# Patient Record
Sex: Male | Born: 1945 | Race: White | Hispanic: No | Marital: Married | Smoking: Former smoker
Health system: Southern US, Community
[De-identification: ages and names within clinical notes are randomized; demographics above are authoritative.]

## PROBLEM LIST (undated history)

## (undated) DIAGNOSIS — I253 Aneurysm of heart: Secondary | ICD-10-CM

## (undated) DIAGNOSIS — M1990 Unspecified osteoarthritis, unspecified site: Secondary | ICD-10-CM

## (undated) DIAGNOSIS — F32A Depression, unspecified: Secondary | ICD-10-CM

## (undated) DIAGNOSIS — C801 Malignant (primary) neoplasm, unspecified: Secondary | ICD-10-CM

## (undated) DIAGNOSIS — E119 Type 2 diabetes mellitus without complications: Secondary | ICD-10-CM

## (undated) DIAGNOSIS — J189 Pneumonia, unspecified organism: Secondary | ICD-10-CM

## (undated) DIAGNOSIS — F4310 Post-traumatic stress disorder, unspecified: Secondary | ICD-10-CM

## (undated) DIAGNOSIS — I2510 Atherosclerotic heart disease of native coronary artery without angina pectoris: Secondary | ICD-10-CM

## (undated) DIAGNOSIS — F329 Major depressive disorder, single episode, unspecified: Secondary | ICD-10-CM

## (undated) DIAGNOSIS — Z5189 Encounter for other specified aftercare: Secondary | ICD-10-CM

## (undated) DIAGNOSIS — K219 Gastro-esophageal reflux disease without esophagitis: Secondary | ICD-10-CM

## (undated) DIAGNOSIS — D179 Benign lipomatous neoplasm, unspecified: Secondary | ICD-10-CM

## (undated) DIAGNOSIS — E785 Hyperlipidemia, unspecified: Secondary | ICD-10-CM

## (undated) DIAGNOSIS — G4730 Sleep apnea, unspecified: Secondary | ICD-10-CM

## (undated) DIAGNOSIS — T7840XA Allergy, unspecified, initial encounter: Secondary | ICD-10-CM

## (undated) DIAGNOSIS — F419 Anxiety disorder, unspecified: Secondary | ICD-10-CM

## (undated) DIAGNOSIS — D649 Anemia, unspecified: Secondary | ICD-10-CM

## (undated) DIAGNOSIS — I10 Essential (primary) hypertension: Secondary | ICD-10-CM

## (undated) HISTORY — DX: Unspecified osteoarthritis, unspecified site: M19.90

## (undated) HISTORY — DX: Sleep apnea, unspecified: G47.30

## (undated) HISTORY — DX: Encounter for other specified aftercare: Z51.89

## (undated) HISTORY — DX: Major depressive disorder, single episode, unspecified: F32.9

## (undated) HISTORY — PX: COLONOSCOPY: SHX174

## (undated) HISTORY — DX: Anxiety disorder, unspecified: F41.9

## (undated) HISTORY — PX: TONSILLECTOMY: SUR1361

## (undated) HISTORY — DX: Aneurysm of heart: I25.3

## (undated) HISTORY — DX: Essential (primary) hypertension: I10

## (undated) HISTORY — PX: APPENDECTOMY: SHX54

## (undated) HISTORY — DX: Malignant (primary) neoplasm, unspecified: C80.1

## (undated) HISTORY — DX: Gastro-esophageal reflux disease without esophagitis: K21.9

## (undated) HISTORY — PX: OTHER SURGICAL HISTORY: SHX169

## (undated) HISTORY — DX: Benign lipomatous neoplasm, unspecified: D17.9

## (undated) HISTORY — DX: Type 2 diabetes mellitus without complications: E11.9

## (undated) HISTORY — DX: Allergy, unspecified, initial encounter: T78.40XA

## (undated) HISTORY — DX: Hyperlipidemia, unspecified: E78.5

## (undated) HISTORY — PX: CARDIAC CATHETERIZATION: SHX172

## (undated) HISTORY — DX: Depression, unspecified: F32.A

---

## 1973-03-26 HISTORY — PX: EXCISIONAL HEMORRHOIDECTOMY: SHX1541

## 1997-08-10 ENCOUNTER — Inpatient Hospital Stay (HOSPITAL_COMMUNITY): Admission: EM | Admit: 1997-08-10 | Discharge: 1997-08-11 | Payer: Self-pay | Admitting: Emergency Medicine

## 2000-03-26 DIAGNOSIS — Z5189 Encounter for other specified aftercare: Secondary | ICD-10-CM

## 2000-03-26 DIAGNOSIS — C801 Malignant (primary) neoplasm, unspecified: Secondary | ICD-10-CM

## 2000-03-26 DIAGNOSIS — IMO0001 Reserved for inherently not codable concepts without codable children: Secondary | ICD-10-CM

## 2000-03-26 HISTORY — DX: Reserved for inherently not codable concepts without codable children: IMO0001

## 2000-03-26 HISTORY — PX: PARTIAL COLECTOMY: SHX5273

## 2000-03-26 HISTORY — DX: Encounter for other specified aftercare: Z51.89

## 2000-03-26 HISTORY — DX: Malignant (primary) neoplasm, unspecified: C80.1

## 2001-09-08 ENCOUNTER — Inpatient Hospital Stay (HOSPITAL_COMMUNITY): Admission: EM | Admit: 2001-09-08 | Discharge: 2001-09-17 | Payer: Self-pay

## 2001-09-08 ENCOUNTER — Encounter (INDEPENDENT_AMBULATORY_CARE_PROVIDER_SITE_OTHER): Payer: Self-pay | Admitting: *Deleted

## 2001-09-11 ENCOUNTER — Encounter: Payer: Self-pay | Admitting: General Surgery

## 2001-09-16 ENCOUNTER — Encounter: Payer: Self-pay | Admitting: Sports Medicine

## 2001-09-30 ENCOUNTER — Encounter: Admission: RE | Admit: 2001-09-30 | Discharge: 2001-09-30 | Payer: Self-pay | Admitting: Sports Medicine

## 2002-04-29 ENCOUNTER — Ambulatory Visit (HOSPITAL_COMMUNITY): Admission: RE | Admit: 2002-04-29 | Discharge: 2002-04-29 | Payer: Self-pay | Admitting: *Deleted

## 2002-04-29 ENCOUNTER — Encounter: Payer: Self-pay | Admitting: *Deleted

## 2002-12-01 ENCOUNTER — Ambulatory Visit (HOSPITAL_COMMUNITY): Admission: RE | Admit: 2002-12-01 | Discharge: 2002-12-01 | Payer: Self-pay | Admitting: Hematology & Oncology

## 2002-12-01 ENCOUNTER — Encounter: Payer: Self-pay | Admitting: Hematology & Oncology

## 2003-06-10 ENCOUNTER — Ambulatory Visit (HOSPITAL_COMMUNITY): Admission: RE | Admit: 2003-06-10 | Discharge: 2003-06-10 | Payer: Self-pay | Admitting: Oncology

## 2003-07-27 ENCOUNTER — Ambulatory Visit (HOSPITAL_BASED_OUTPATIENT_CLINIC_OR_DEPARTMENT_OTHER): Admission: RE | Admit: 2003-07-27 | Discharge: 2003-07-27 | Payer: Self-pay | Admitting: Emergency Medicine

## 2004-06-23 ENCOUNTER — Ambulatory Visit: Payer: Self-pay | Admitting: Oncology

## 2004-06-27 ENCOUNTER — Ambulatory Visit (HOSPITAL_COMMUNITY): Admission: RE | Admit: 2004-06-27 | Discharge: 2004-06-27 | Payer: Self-pay | Admitting: Oncology

## 2004-12-29 ENCOUNTER — Ambulatory Visit: Payer: Self-pay | Admitting: Oncology

## 2005-01-02 ENCOUNTER — Ambulatory Visit (HOSPITAL_COMMUNITY): Admission: RE | Admit: 2005-01-02 | Discharge: 2005-01-02 | Payer: Self-pay | Admitting: Oncology

## 2005-06-14 IMAGING — CT CT ABDOMEN W/ CM
2 of 7 series · 11 of 35 positions shown, 18 images · IV contrast (omnipaque)
Comparison: 12/01/02.

CLINICAL DATA: Colon cancer ? post partial colectomy.  Follow-up exam.
 CT CHEST, ABDOMEN, AND PELVIS WITH CONTRAST
TECHNIQUE: Multidetector helical imaging carried out through the chest, abdomen, and pelvis utilizing oral and IV contrast (150 cc of Omnipaque 300 ? nonionic contrast used because of a history of asthma).

[Series 2: — · coronal · 1.33mm/px · 1 of 3 slices shown, 2 images (1 of 2)]
[im 2/3  soft-tissue]
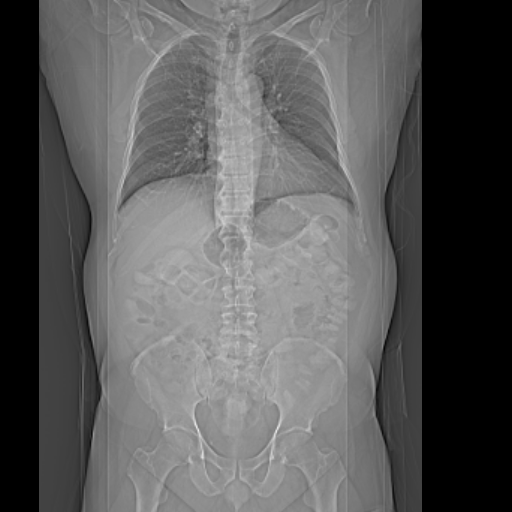
[im 2/3  bone]
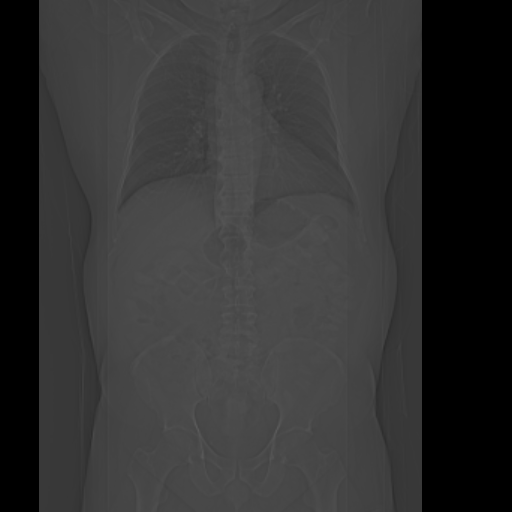

[Series 5: — · axial · 0.81mm/px · z∈[+788,+1208]mm · 10 of 104 slices shown, 16 images (2 of 2)]
[im 10/104  soft-tissue]
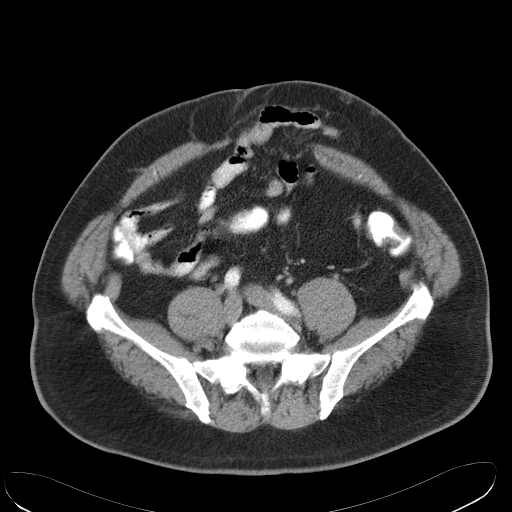
[im 10/104  bone]
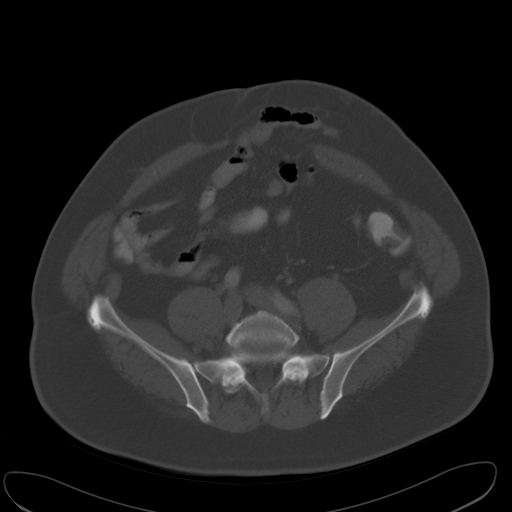
[im 19/104  soft-tissue]
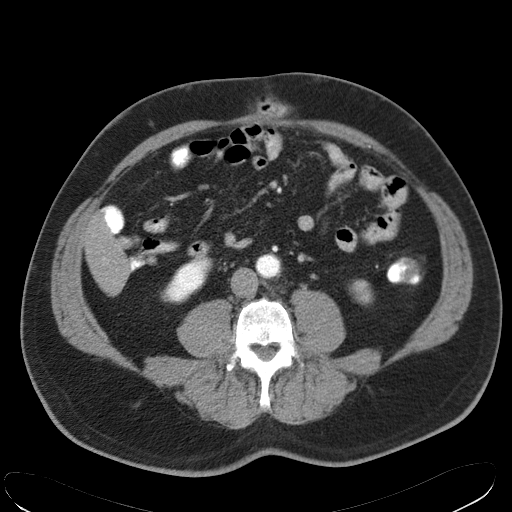
[im 29/104  soft-tissue]
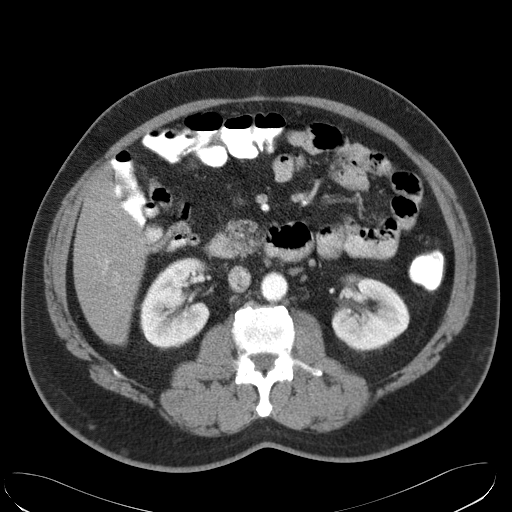
[im 38/104  soft-tissue]
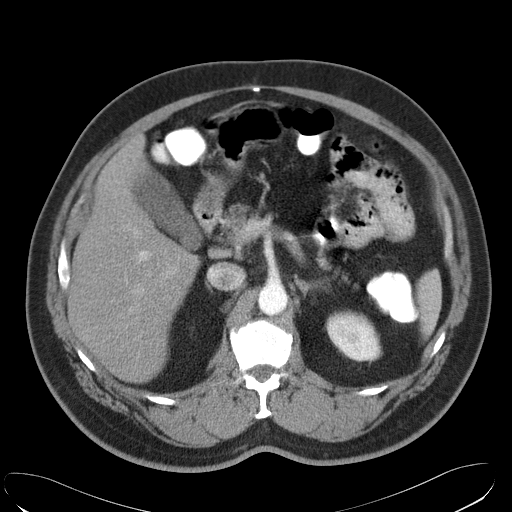
[im 47/104  soft-tissue]
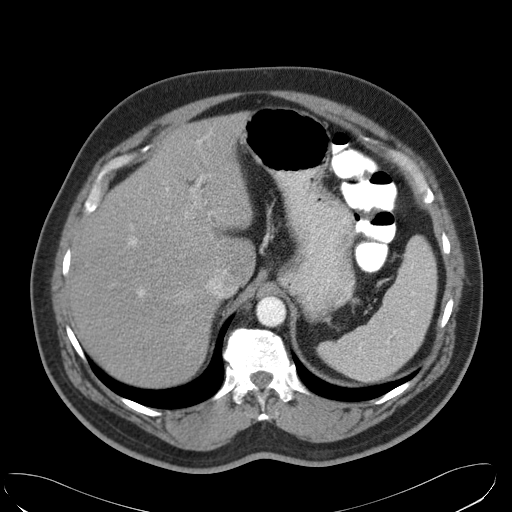
[im 57/104  soft-tissue]
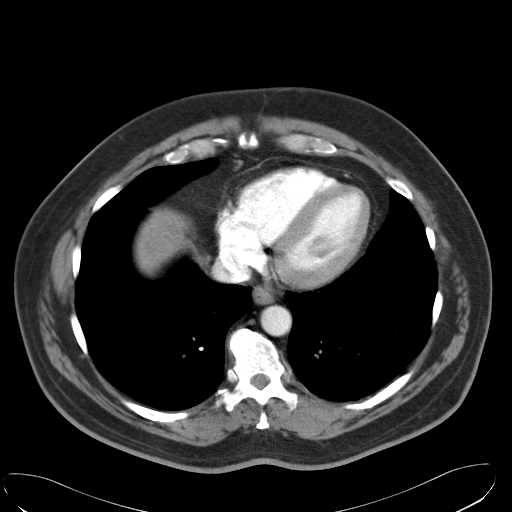
[im 66/104  soft-tissue]
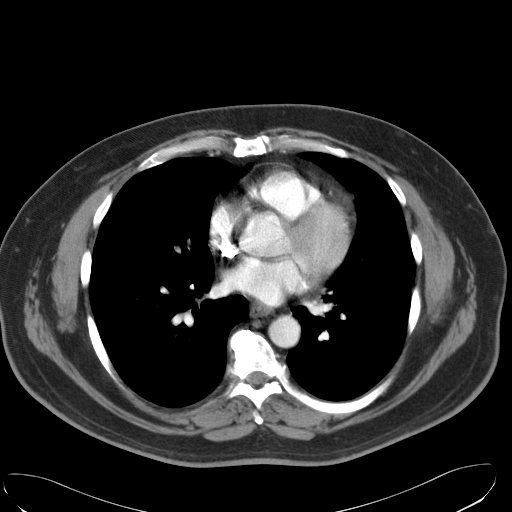
[im 66/104  lung]
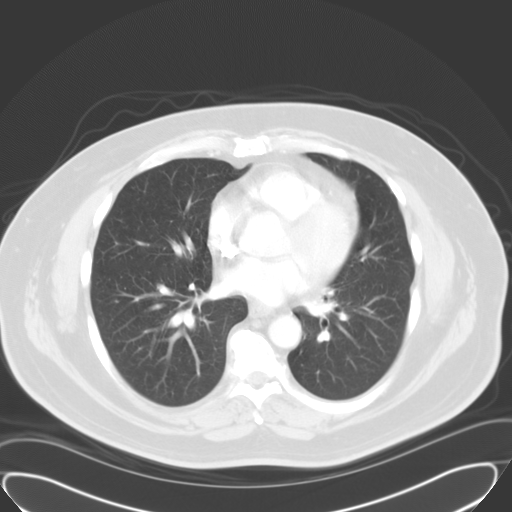
[im 75/104  soft-tissue]
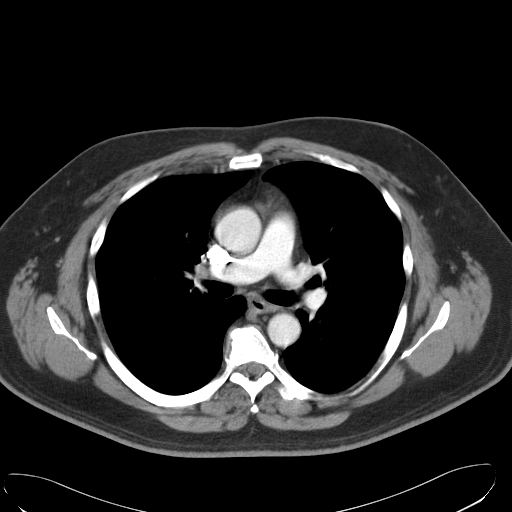
[im 75/104  lung]
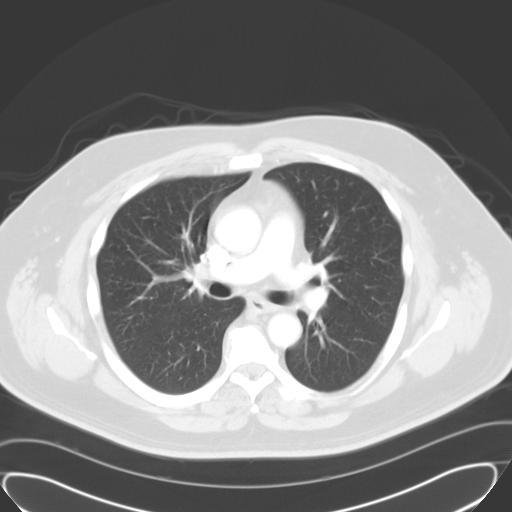
[im 85/104  soft-tissue]
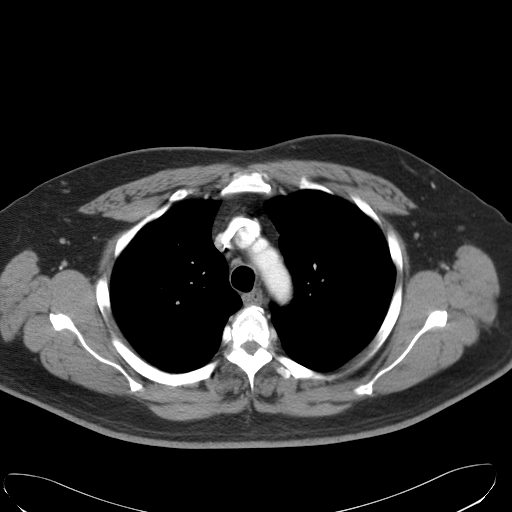
[im 85/104  lung]
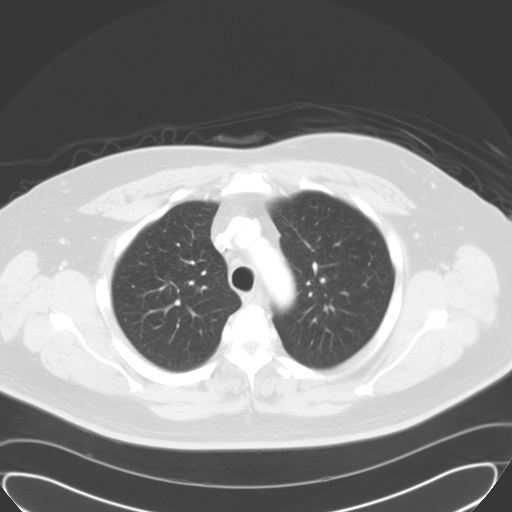
[im 85/104  bone]
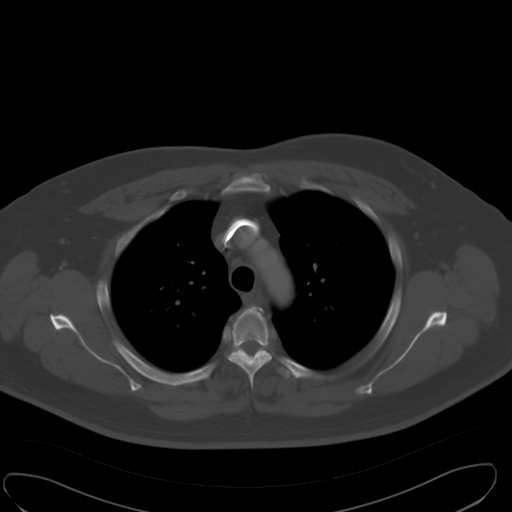
[im 94/104  soft-tissue]
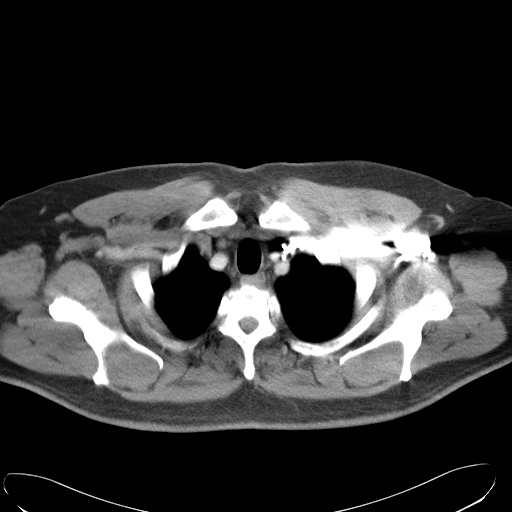
[im 94/104  lung]
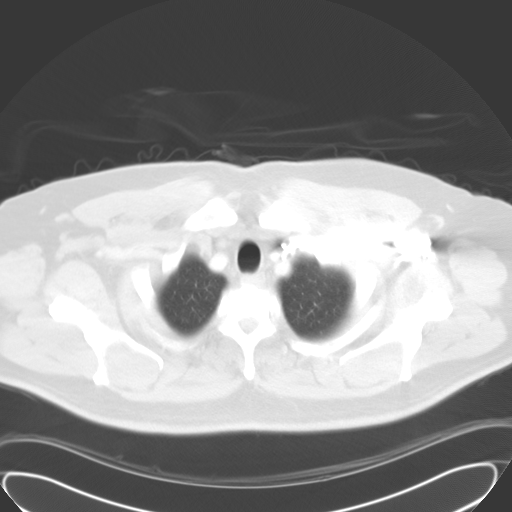

[11 of 35 positions shown; findings below may reference images not displayed]

CT CHEST WITH CONTRAST 
 No mediastinal adenopathy.  No pleural or pericardial fluid.  No lung nodules or airspace disease.  No obvious chest wall lesions. 
 IMPRESSION
 No metastatic disease or other significant findings.
 CT ABDOMEN WITH CONTRAST 
 No lesions of the liver or spleen.  Pancreas and adrenals normal.  No calcified gallstones or biliary ductal dilation.  Kidneys normal.  No ascites or adenopathy. 
 Again noted is a ventral hernia beginning in the periumbilical area and extending down into the pelvis.  It contains loops of small bowel as before.  No evidence for incarceration or obstruction.  
 IMPRESSION
 1.  No evidence for metastatic disease.
 2.  Anterior ventral hernia as before.
 CT PELVIS WITH CONTRAST 
 No focal masses, adenopathy, or fluid collections.  Prostate mildly enlarged containing one calcification. 
 IMPRESSION
 No evidence for metastatic disease or other significant findings.  The prostate appears mildly enlarged.

## 2005-07-09 ENCOUNTER — Ambulatory Visit: Payer: Self-pay | Admitting: Oncology

## 2005-07-12 ENCOUNTER — Ambulatory Visit (HOSPITAL_COMMUNITY): Admission: RE | Admit: 2005-07-12 | Discharge: 2005-07-12 | Payer: Self-pay | Admitting: Oncology

## 2005-08-30 ENCOUNTER — Ambulatory Visit: Payer: Self-pay | Admitting: Gastroenterology

## 2005-09-13 ENCOUNTER — Ambulatory Visit: Payer: Self-pay | Admitting: Internal Medicine

## 2006-01-10 ENCOUNTER — Ambulatory Visit: Payer: Self-pay | Admitting: Oncology

## 2006-02-08 ENCOUNTER — Inpatient Hospital Stay (HOSPITAL_COMMUNITY): Admission: EM | Admit: 2006-02-08 | Discharge: 2006-02-12 | Payer: Self-pay | Admitting: Emergency Medicine

## 2006-07-08 ENCOUNTER — Ambulatory Visit: Payer: Self-pay | Admitting: Oncology

## 2006-07-09 LAB — COMPREHENSIVE METABOLIC PANEL
BUN: 15 mg/dL (ref 6–23)
CO2: 28 mEq/L (ref 19–32)
Creatinine, Ser: 0.93 mg/dL (ref 0.40–1.50)
Glucose, Bld: 97 mg/dL (ref 70–99)
Total Bilirubin: 1 mg/dL (ref 0.3–1.2)

## 2006-07-09 LAB — CBC WITH DIFFERENTIAL (CANCER CENTER ONLY)
BASO#: 0 10*3/uL (ref 0.0–0.2)
Eosinophils Absolute: 0.2 10*3/uL (ref 0.0–0.5)
HGB: 14.7 g/dL (ref 13.0–17.1)
MCH: 32.4 pg (ref 28.0–33.4)
MCV: 95 fL (ref 82–98)
MONO#: 0.5 10*3/uL (ref 0.1–0.9)
MONO%: 8.8 % (ref 0.0–13.0)
NEUT#: 2.2 10*3/uL (ref 1.5–6.5)
RBC: 4.53 10*6/uL (ref 4.20–5.70)
WBC: 5.4 10*3/uL (ref 4.0–10.0)

## 2006-07-09 LAB — CEA: CEA: 1.5 ng/mL (ref 0.0–5.0)

## 2006-07-11 ENCOUNTER — Ambulatory Visit (HOSPITAL_COMMUNITY): Admission: RE | Admit: 2006-07-11 | Discharge: 2006-07-11 | Payer: Self-pay | Admitting: Oncology

## 2007-07-01 ENCOUNTER — Ambulatory Visit: Payer: Self-pay | Admitting: Oncology

## 2007-07-09 LAB — COMPREHENSIVE METABOLIC PANEL
ALT: 27 U/L (ref 0–53)
AST: 22 U/L (ref 0–37)
Albumin: 4.3 g/dL (ref 3.5–5.2)
Alkaline Phosphatase: 41 U/L (ref 39–117)
Glucose, Bld: 96 mg/dL (ref 70–99)
Potassium: 3.5 mEq/L (ref 3.5–5.3)
Sodium: 141 mEq/L (ref 135–145)
Total Protein: 7.1 g/dL (ref 6.0–8.3)

## 2007-07-09 LAB — MANUAL DIFFERENTIAL (CHCC SATELLITE)
ALC: 3.5 10*3/uL — ABNORMAL HIGH (ref 0.9–3.3)
MONO: 2 % (ref 0–13)
PLT EST ~~LOC~~: ADEQUATE
SEG: 32 % — ABNORMAL LOW (ref 40–75)

## 2007-07-09 LAB — CBC WITH DIFFERENTIAL (CANCER CENTER ONLY)
HCT: 42.6 % (ref 38.7–49.9)
HGB: 14.6 g/dL (ref 13.0–17.1)
RDW: 12.1 % (ref 10.5–14.6)
WBC: 5.6 10*3/uL (ref 4.0–10.0)

## 2007-07-10 ENCOUNTER — Ambulatory Visit (HOSPITAL_COMMUNITY): Admission: RE | Admit: 2007-07-10 | Discharge: 2007-07-10 | Payer: Self-pay | Admitting: Oncology

## 2010-05-17 ENCOUNTER — Other Ambulatory Visit: Payer: Self-pay | Admitting: Emergency Medicine

## 2010-08-11 NOTE — Cardiovascular Report (Signed)
NAME:  Ryan Hernandez, Ryan Hernandez NO.:  [id number]   MEDICAL RECORD NO.:  [id number]          PATIENT TYPE:  INP   LOCATION:  2807                         FACILITY:  [facility]   PHYSICIAN:  Vesta Mixer, M.D. DATE OF BIRTH:  1945-11-24   DATE OF PROCEDURE:  DATE OF DISCHARGE:                              CARDIAC CATHETERIZATION   PROCEDURE:  Was a percutaneous transluminal coronary angioplasty of a second  diagonal vessel.   [name] is a 65 year old gentleman with new onset chest pain.  He was  admitted by Dr. Donnie Aho over the weekend.  He had a diagnostic heart  catheterization by Dr. Donnie Aho which revealed a tight second diagonal  stenosis.  He has a tight second obtuse marginal artery.  He also has a  tight distal right coronary artery stenosis.  We decided to proceed with  intervention of the larger of vessel which is the diagonal vessel.   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  Please see the dictated note from Dr. York Spaniel diagnostic  catheterization.   The left main was cannulated using a 6-French Judkins left 4.5 guide.  An  Asahi soft wire was placed down into the diagonal vessel.  A 2.0 x 15-mm  Maverick was positioned across the stenosis.  It was inflated up to 6  atmospheres for 26 seconds followed by 12 atmospheres for 50 seconds.  This  resulted in some improvement but was still clearly an inadequate result.  A  2.5 x 15-mm balloon was then positioned across the stenosis and was inflated  up to 6 atmospheres for 21 seconds followed by 8 atmospheres for 48 seconds.  One inflation of 12 atmospheres for 56 seconds was performed.  This resulted  in a nice angiographic lumen.  There is a mild amount of residual stenosis.  The diameter of the vessel proximal to the aneurysm is approximately equal  to the diameter distal to the aneurysm.  It was decided not to place a stent  because the stent would need to  extend out into the aneurysm.  I am not sure  that we would be able to adequately post dilate that stent.   COMPLICATIONS:  None.   CONCLUSION:  1. 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  2. Status post successful percutaneous transluminal coronary intervention      of the second diagonal artery.   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  He has got brisk  flow at this time.  If he restenosis, then we would need to consider putting  in a stent.  He has diffuse disease throughout his coronary anatomy.  He has  a tight stenosis in the second obtuse marginal artery.  This vessel was  subtotally occluded, and it is fairly small in diameter.  It is  approximately 1 to 1.5 mm in diameter.   The right coronary artery is large and dominant.  He has a distal stenosis  of approximately 60% in the distal right coronary artery.  We will continue  with Plavix.           ______________________________  Vesta Mixer, M.D.     PJN/MEDQ  D:  02/11/2006  T:  02/11/2006  Job:  [id number]   cc:   Georga Hacking, M.D.

## 2010-08-11 NOTE — H&P (Signed)
NAME:  Ryan Hernandez, Ryan Hernandez NO.:  [id number]   MEDICAL RECORD NO.:  [id number]          PATIENT TYPE:  EMS   LOCATION:  MAJO                         FACILITY:  [facility]   PHYSICIAN:  Georga Hacking, M.D.DATE OF BIRTH:  1945-07-10   DATE OF ADMISSION:  02/08/2006  DATE OF DISCHARGE:                              HISTORY & PHYSICAL   REASON FOR ADMISSION:  Chest pain.   HISTORY:  This 65 year old male is brought into the hospital to evaluate  chest pain and to rule out myocardial infarction. The patient has a  previous history of hypertension, hyperlipidemia, and obesity. He states  he was hospitalized around 7-8 years ago with fluid around his heart but  cannot remember the details of the work up.  He also states that he had  a stress test followed by a nuclear stress-test around 4-5 years ago but  cannot remember who he saw and does not give the name of the  cardiologist. He was in his usual state of health today and was at work  when he had onset of midsternal chest discomfort  described as pressure  or heaviness which radiated to the inner aspect of the left arm and was  associated with mild shortness of breath. He got up and walked around,  the discomfort eased off but returned and became more severe, and again  radiated to the left arm and was associated with shortness of breath.  He was brought by EMS to the emergency room where the pain was relieved  with nitroglycerin and after a nitroglycerin and aspirin. He has had  some slight recurrence of the pain since then.  His initial EKG and  initial cardiac enzymes were unremarkable.   PAST MEDICAL HISTORY:  1. He has a previous history of hypertension.  2. There is a possible history of fluid around the heart.  3. He has a previous history of diverticulosis for several years.  4. He has a known history of hyperlipidemia.  5. There is a previous history of prostatitis and a previous history      of hemorrhoids as  well as lipomas.   PAST SURGICAL HISTORY:  Tonsillectomy; appendectomy; he has had surgery  for colon cancer with a partial colectomy in 2003.   ALLERGIES:  None.   CURRENT MEDICATIONS:  Lipitor, Hyzaar, Norvasc and Prevacid.   FAMILY HISTORY:  Father died at age 24 of congestive heart failure. His  mother is living and has hypertension. He has two half-brothers living  and two sons living and well. He has an adopted daughter and an adopted  granddaughter.   SOCIAL HISTORY:  He has been married to his wife for 37 years. He has  two sons and an adopted daughter 49, and a 77-year-old granddaughter they  have also adopted.  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 He attends [employer]. He has a history of cigarette smoking for 30 years, he  quit smoking around 23 years ago. No significant alcohol use.   REVIEW OF SYSTEMS:  He has gained some weight in the  past year. He has  no skin problems. He wears glasses but no history of glaucoma or  cataracts. He has mild difficulty with hearing. He has no difficulty  swallowing. He has a history of dyspepsia and takes Prevacid. He has a  history of prostatitis and has some mild hesitancy. He has a history of  low back pain, has arthritis of the knees and has been told he might  need to have a knee replacement. He describes indigestion previously.   PHYSICAL EXAMINATION:  GENERAL: He is a pleasant male appearing his  stated age who is in no acute distress.  VITAL SIGNS: His blood pressure was 123/68, temperature was 97.1, pulse  was 74.  SKIN: Warm and dry. He is somewhat balding.  ENT: Wears glasses. Normocephalic, atraumatic. Fundi not examined.  Pharynx negative, no occipital masses, JVD or thyromegaly. No bruits.  LUNGS: Clear to A&P.  CARDIAC EXAM: Normal S1 and S2, no S3, no murmur.  ABDOMEN: Soft, nontender, mild ventral hernia noted. Femoral and distal  pulses 2+, no edema noted.   A 12-lead EKG is  normal.   LABORATORY DATA:  Shows a normal CBC. Normal PT and PTT. His glucose is  102, otherwise chemistry profile is normal. Initial point of care  enzymes was normal.   IMPRESSION:  1. Chest discomfort suggestive of unstable angina pectoris with an      unremarkable EKG.  2. Hypertension.  3. Hyperlipidemia under treatment.  4. History of colon cancer.  5. Obesity.   RECOMMENDATIONS:  The patient will be placed on IV heparin, IV  nitroglycerin. Myocardial infarction will be ruled out. He will be  placed on beta blockers. Because of the recurrent nature of the pain we  will need to proceed with catheterization.      Georga Hacking, M.D.  Electronically Signed     WST/MEDQ  D:  02/08/2006  T:  02/08/2006  Job:  [id number]   cc:   Brett Canales A. Cleta Alberts, M.D.

## 2010-08-11 NOTE — H&P (Signed)
[place]  Patient:    Ryan Hernandez, Ryan Hernandez Visit Number: [id number] MRN: [id number]          Service Type: MED Location: [phone] Attending Physician:  Garnette Scheuermann Dictated by:   Arlis Porta, M.D. Admit Date:  09/08/2001   CC:         Dr. Cleta Alberts, [facility]   History and Physical  CHIEF COMPLAINT:  Rectal bleeding.  HISTORY OF PRESENT ILLNESS:  This is a 65 year old white male with a history of diverticulosis and GI bleeding as recently as 4-1/2 years ago, who presents to his appropriate medical doctor with bright red blood per rectum x3-4 weeks. He has noticed occasional special blood in the stools, progressively getting worse until last night he had a bowel movement with blood that covered the bowl with red.  At his physicians office his hemoglobin was checked and noticed to be a 9.8 which is decreased from his baseline of 14.6 from October of 2002.  He was then sent for further workup in the ED. Associated with this he has noticed some abdominal pain across his middle abdomen which comes and goes, lasting 30 seconds.  Episodes occur every 15-20 minutes.  It is described as stabbing in nature.  It is better with manual pressure and worse with eating, but no specific food patterns are found.  The patient denies any pain with defecation.  He denies any nausea or vomiting. The patient did have an episode of diarrhea three days ago.  The patient has had some sick contacts with upper respiratory symptoms at work.  The patient denies any recent travel The patient has had a recent antibiotic use three months ago for prostatitis.  REVIEW OF SYSTEMS:  The patient denies any fevers.  No cough.  No chest pain. No shortness of breath.  No dysuria.  No hematuria.  PAST MEDICAL HISTORY: 1. Internal hemorrhoids 25 years ago. 2. Diverticulitis 4-1/2 years ago with endoscopy showing no signs of polyps    or cancer. 3.  Hypertension. 4. Hypercholesterolemia. 5. Status post appendectomy. 6. Status post tonsillectomy. 7. Skin "fatty tumors." 8. History of prostatitis with a baseline PSA of 2.44 from May of 2003.  MEDICATIONS: 1. Lipitor 10 mg q.h.s. 2. Hyzaar 100/25 mg q.d. 3. Norvasc 10 mg q.d. 4. Celebrex p.r.n., but the patient denies taking these within the last two    months. 5. Tylenol P.M. q.h.s. for insomnia.  ALLERGIES:  No known drug allergies.  SOCIAL HISTORY:  The patient is a former smoker, last smoking 19 years ago. He does admit to occasional alcohol use, but very infrequently.  His last use was six months ago.  The patient denies any other drug use.  The patient has several titles including a Ship broker, Aeronautical engineer, receiving clerk.  He does admit to oftentimes requiring heavy lifting.  The patient currently lives with his wife and adopted daughter who is four years old.  FAMILY HISTORY:  The patients father is deceased at age 75 secondary to CHF. The patients mother is still alive with a history of hypertension.  The patient has two half sisters who are alive and well.  The patient has two sons who are also alive and well.  PHYSICAL EXAMINATION:  VITAL SIGNS:  Temperature 98.9, pulse was 91, respiratory rate was 14, blood pressure was 108/61, height was 5 feet 10 inches, weight was 236 pounds. Orthostatics were checked, which showed a blood pressure lying down  of 110/70 with a pulse of 82.  Standing up his blood pressure was 120/68 with a pulse of 84.  GENERAL:  He was a pleasant white male in no acute distress.  HEENT:  The patient had no scleral icterus.  His oropharynx showed no erythema or exudates.  LUNGS:  Clear to auscultation bilaterally with good air movement.  HEART:  Regular rate and rhythm, no murmurs.  ABDOMEN:  Mildly obese, soft with normal active bowel sounds.  Tender to palpation at the right upper quadrant and epigastrium.  No  hepatomegaly.  RECTAL:  Per primary M.D.s office, showed bright red blood.  EXTREMITIES:  No edema.  Moves all four extremities well.  LABORATORY:  White blood cell count was 9.2, hemoglobin was 9.8, hematocrit was 31.5, platelet count was 446.  The patient had baseline labs from October which showed white blood cell count showing 5.2, hemoglobin 14.6, hematocrit 42.7, platelet count 251.  Sodium 141, potassium 4, chloride 101, CO2 26, BUN 13, creatinine 1.1, glucose 90, calcium 9.8.  AST 23, ALT 30, alkaline phosphatase 67, total bilirubin 1.7, total protein 7.3, albumin 4.3.  Also from October he had a cholesterol count which showed total cholesterol of 178, triglycerides 183, HDL 36, and LDL 105.  PSA as recently as May showed a level of 2.44.  ASSESSMENT/PLAN:  A 65 year old white male with lower gastrointestinal bleed, abdominal pain, decreased hemoglobin.  1. Lower gastrointestinal bleed.  Differential diagnoses include    diverticulosis or diverticulitis, hemorrhoids, polyps of cancer, or    upper gastrointestinal bleed.  We will check serial CBCs q.8h. to make    sure that he is not acutely losing too much blood.  We will start a PPI    empirically.  IV fluids can be run for support.  GI was called for    endoscopy to be performed in the morning.  We will also check coags.    Hold NSAIDS.  Type and cross if a big drop in the hemoglobin. 2. Abdominal pain.  We will recheck LFTs and amylase for his abdominal pain.    Consider KUB. 3. Hypertension.  Currently well-controlled.  We will continue with Hyzaar and    Norvasc.  We will hold these if he becomes hypertensive. 4. Hypercholesterolemia.  His cholesterol seems to be under good control.  We    will continue with Lipitor for now.  Will check a fasting lipid profile    in the morning. 5. Increased PSA.  This is most likely due to recent prostatitis as his PSA    has come back down to normal.  We will check another level just  to make    sure that it is stable.  6. Fluid, electrolytes, and nutrition.  We will check a CMET as stated above.    We will replete any electrolytes as needed.  We will start him on clears    for procedure tomorrow.  Afterwards he may have a cardiac low salt diet. Dictated by:   Arlis Porta, M.D. Attending Physician:  Garnette Scheuermann DD:  09/08/01 TD:  09/09/01 Job: [id number]

## 2010-08-11 NOTE — Consult Note (Signed)
[place]  Patient:    Ryan Hernandez, Ryan Hernandez Visit Number: [id number] MRN: [id number]          Service Type: MED Location: [phone] Attending Physician:  Garnette Scheuermann Dictated by:   Jimmye Norman, M.D. Proc. Date: 09/10/01 Admit Date:  09/08/2001   CC:         Royal Hawthorn B. Darrick Penna, M.D.  Wilhemina Bonito. Eda Keys., M.D. [facility]   Consultation Report  Thank you very much for asking me to see Mr. Lundberg, a very pleasant 65 year old gentleman plagued recently with rectal bleeding and having had a history of diverticulosis, who now has been diagnosed with a wide colon ascending colonic carcinoma.  The patient was admitted on September 08, 2001, with rectal bleeding.  At that time, he had bled down to a hemoglobin requiring transfusion.  He subsequently had a colonoscopy which demonstrated a large cancer about 5-6 cm in the right ascending colon.  A surgical consultation was obtained.  PAST MEDICAL HISTORY:  His past history is significant for: 1. Internal hemorrhoids many years ago, diverticulosis, and diverticulitis. 2. Hypertension. 3. Hypercholesterolemia.  PAST SURGICAL HISTORY: 1. Status post appendectomy. 2. Status post tonsillectomy. 3. Removal of some lipomas.  MEDICATIONS: 1. Lipitor 10 mg p.o. q.h.s. 2. Hyzaar 100/25 mg q.d. 3. Norvasc 10 mg a day. 4. Celebrex p.r.n., but he has not taken it for a while. 5. Tylenol p.r.n.  ALLERGIES:  He has no known drug allergies.  PHYSICAL EXAMINATION:  He is well nourished, well developed, and in no acute distress.  He is a little bit upset from having a recent diagnosis of colon cancer.  VITAL SIGNS:  Stable.  ABDOMEN:  He has no abdominal tenderness.  He has an old right lower quadrant scar.  LABORATORY DATA:  His most recent hemoglobin was 8.6, now from 9.0 yesterday morning at 10:30 a.m.  He is getting two units of blood currently.  His platelet count is 352,000.  He has blood work for  tomorrow.  IMPRESSION:  Gastrointestinal bleed secondary to colon tumor.  PLAN:  Take advantage of his in-hospital preoperative bowel prep and give him some antibiotics along with a little bit more GoLYTELY, preparing him for surgery on Friday.  At that time, we plan a right colectomy for primary anastomosis.  The risks and benefits have been explained to the patient and he wishes to proceed.  Preoperative CEA level has been drawn and will be done. Dictated by:   Jimmye Norman, M.D. Attending Physician:  Garnette Scheuermann DD:  09/10/01 TD:  09/11/01 Job: [id number]

## 2010-08-11 NOTE — Discharge Summary (Signed)
NAME:  Ryan Hernandez, Ryan Hernandez NO.:  [id number]   MEDICAL RECORD NO.:  [id number]          PATIENT TYPE:  INP   LOCATION:  6532                         FACILITY:  [facility]   PHYSICIAN:  Georga Hacking, M.D.DATE OF BIRTH:  06/06/1945   DATE OF ADMISSION:  02/08/2006  DATE OF DISCHARGE:  02/12/2006                               DISCHARGE SUMMARY   FINAL DIAGNOSES:  1. Chest pain consistent with unstable angina.      a.     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      b.     Residual stenosis in second marginal branch to be treated       medically.  2. Hypertension.  3. Hyperlipidemia.  4. Obesity.   PROCEDURES:  Cardiac catheterization February 11, 2006.  PTCA of  diagonal branch of LAD February 11, 2006, by Dr. Elease Hashimoto.   HISTORY:  The patient is a 65 year old male who has a previous history  of hypertension, hyperlipidemia, and diverticulitis.  He has been  feeling well but presented to the emergency room with midsternal chest  discomfort, described as pressure heaviness that radiated to the inner  aspect of the left arm and associated with mild shortness of breath.  Discomfort became more severe, and he was brought to the emergency room  where he was admitted for further evaluation.  Please see the previously  dictated history and physical for remained of the details.   HOSPITAL COURSE:  White count is 6100, hematocrit is 43.8, hemoglobin  15.1.  PT and PTT were normal on admission.  Chemistry panel showed a  random glucose of 102, otherwise, was completely normal.  Serial CPK and  troponins were normal.  Lipid panel shows a cholesterol of 176,  triglycerides 248, HDL of 36 and LDL cholesterol of 90.  TSH is 1.797.   The patient was admitted and placed on heparin.  He had some recurrence  of chest pain and was given intravenous nitroglycerin.  He was taken to  the cardiac catheterization laboratory on February 11, 2006,  with  findings of a severe stenosis involving the first diagonal branch which  had a mild aneurysm formation in it.  He had subtotal occlusion of the  second marginal branch which was a somewhat small vessel.  It was  thought the diagonal branch was the culprit vessel, and he underwent  PTCA of this by Dr. Elease Hashimoto.  Because of a vessel size mismatch with  aneurysm, he did not put a stent in the diagonal branch.  There was  moderate proximal irregularity in the LAD, and the right coronary artery  had a 50-60% distal stenosis noted.  He tolerated the procedure well,  was started on Plavix, and was pain free following the procedure.  He  received cardiac education and the importance of losing weight and  modifying his risk factors was discussed with him.  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  He is discharged at  this time  in improved condition on:  1. Plavix 75 mg daily.  2. Aspirin 81 mg daily.  3. Hyzaar 100/25 daily.  4. Norvasc 5 mg daily.  5. Nexium 40 mg daily.   He is to follow up with me in 1 week and needs to be out of work until  after Thanksgiving.  He is to call for an appointment to be seen in 1  week and is to see Dr. Lesle Chris for medical follow-up.  He is to be  seen by cardiac rehab also.      Georga Hacking, M.D.  Electronically Signed     WST/MEDQ  D:  02/12/2006  T:  02/12/2006  Job:  [id number]   cc:   Brett Canales A. Cleta Alberts, M.D.

## 2010-08-11 NOTE — Cardiovascular Report (Signed)
NAME:  Ryan Hernandez, Ryan Hernandez NO.:  [id number]   MEDICAL RECORD NO.:  [id number]          PATIENT TYPE:  INP   LOCATION:  2010                         FACILITY:  [facility]   PHYSICIAN:  Georga Hacking, M.D.DATE OF BIRTH:  12-Nov-1945   DATE OF PROCEDURE:  02/11/2006  DATE OF DISCHARGE:                            CARDIAC CATHETERIZATION   HISTORY:  The patient is a 65 year old male with a history of  hypertension, hyperlipidemia and obesity.  He presented with chest  discomfort consistent with unstable angina with radiation to the left  arm that occurred at work on Friday and reoccurred on two occasions.  Subsequently cardiac enzymes have been unremarkable.  EKG was  unremarkable and he is stabilized on heparin and IV nitroglycerin.  He  is brought to catheterization laboratory for catheterization and  possible intervention.   PROCEDURE:  Left heart catheterization with coronary angiograms and left  ventriculogram.   PROCEDURE:  The right femoral artery was entered using a single anterior  needle wall stick Angiograms were made using 6-French catheters and a 30  mL ventriculogram was performed. The patient was left on the table,  films were reviewed and he was found to have a severe stenosis of the  diagonal branch as well as a circumflex marginal branch.  Dr. Kristeen Miss scrubbed in on the case at that point to do PCI on the diagonal  branch.  He and I reviewed the case and thought perhaps the circumflex  marginal may be too small for intervention.  A second IV was started,  ACT was measured and case was turned over to Dr. Elease Hashimoto at this point.   HEMODYNAMIC DATA:  Aorta post contrast 130/80, LV postcontrast 130/6-17.   ANGIOGRAPHIC DATA:  Left ventriculogram:  Performed in the 30 degrees  RAO projection.  The aortic valve is normal.  The mitral valve is  normal.  The left ventricle appears normal in size.  Estimated ejection  fraction is 60%. Coronary arteries  arise and distribute normally. Very  minimal calcification is noted at the left main coronary artery is  normal.  The left anterior descending has mild irregularity proximally  in the mid portion.  A first diagonal branch is moderate size and has  scattered irregularities proximally. There is a severe segmental 80-95%  stenosis prior to an area of aneurysmal formation which is then followed  by two branching diagonals.  A large intermediate branch arises and has  a 30-40% stenosis at its ostium.  The circumflex marginal branch has a  severe subtotal 95% stenosis after the first marginal branch involving  the second marginal branch.  This appears to be a long segment of  diffuse disease.  This is a somewhat small caliber second marginal  branch.  The right coronary artery is a dominant vessel supplying  several assessed by the posterior descending is somewhat small posterior  lateral branches.  There is a distal 50-60% stenosis in the area of  tortuosity.   IMPRESSION:  1. Significant coronary disease with severe stenosis involving the      first diagonal branch followed by  an area of aneurysm formation.  2. Severe circumflex stenosis vessel may be too small for      intervention.   RECOMMENDATIONS:  Dr. Kristeen Miss assisted in the case for the PCI  portion.  He will consider percutaneous intervention of the first  diagonal branch and then depending on what the circumflex looks like  after nitro, decide whether something needs to be done about better  whether this could be managed medically.      Georga Hacking, M.D.  Electronically Signed     WST/MEDQ  D:  02/11/2006  T:  02/11/2006  Job:  [id number]   cc:   Brett Canales A. Cleta Alberts, M.D.

## 2010-08-11 NOTE — Consult Note (Signed)
[place]  Patient:    Ryan, Hernandez Visit Number: [id number] MRN: [id number]          Service Type: MED Location: [phone] Attending Physician:  Garnette Scheuermann Dictated by:   Rosemarie Ax, N.P. Admit Date:  09/08/2001 Discharge Date: 09/17/2001   CC:         Jimmye Norman, M.D.  Sibyl Parr. Darrick Penna, M.D.  Wilhemina Bonito. Eda Keys., M.D. [facility]   Consultation Report  DATE OF BIRTH:  19-Mar-1946  REASON FOR CONSULTATION:  Colon cancer.  REQUESTING PHYSICIAN:  Jimmye Norman, M.D.  HISTORY OF PRESENT ILLNESS:  This is a 65 year old gentleman admitted with lower GI bleed.  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  TNM code T3 PN0 PMx with negative margins and no invasion of tumor through the propria.  The grade was moderately to poorly differentiated.  Ryan Hernandez has a four year history of diverticulosis with intermittent bleeding.  A colonoscopy performed four and a half years ago showed polyps which were "nonmalignant."  At that time his pain was right-sided and most recently his pain has moved to be periumbilical and upper mid gastric.  A CT of the chest, abdomen, and pelvis were completed today.  The report is now pending.  PAST MEDICAL HISTORY: 1. Prostatitis. 2. Hypertension. 3. Hypercholesterolemia. 4. Internal hemorrhoids 25 years ago. 5. Diverticulosis/diverticulitis diagnosed four years ago. 6. Multiple lipomas.  PAST SURGICAL HISTORY: 1. T&A. 2. Appendectomy.  ALLERGIES:  No known drug allergies.  MEDICATIONS: 1. Lipitor 10 mg one p.o. q.h.s. 2. Hyzaar 100/25 mg one p.o. q.d. 3. Norvasc 10 mg one p.o. q.d. 4. Celebrex dosage unknown p.r.n. 5. Tylenol PM q.h.s. for sleep.  FAMILY HISTORY:  Ryan Hernandez father died at age 21 of CHF.  His mother is alive with hypertension.  He has two half-brothers alive and well and two sons alive and  well.  He has one adopted daughter who has a 47-year-old daughter that he and his wife have adopted.  SOCIAL HISTORY:  He has been married to Crawford for 33 years.  Again, they have two sons age 56 and 48, one adopted daughter 36 and her 5-year-old daughter whom they have adopted.  He lives in [place].  He also works as a IT consultant at [employer].  He has a 30-pack-year smoking history and quit 19 years ago.  He has a remote history of ethanol use.  REVIEW OF SYSTEMS:  Positive for GI bleeding for two months.  He has had increased weakness and fatigue.  He has also had a recent headache.  He denies any shortness of breath, chest pain, or pleuritic pain.  He has had constipation, but no diarrhea and no night sweats.  He also denies any fever. He has had increased indigestion.  PHYSICAL EXAMINATION  GENERAL:  This is a 65 year old white male in no acute distress.  VITAL SIGNS:  Temperature 97.3, pulse 88, respirations 18, blood pressure 120/73.  HEENT:  Normocephalic.  PERRLA.  Sclerae anicteric.  EOMs intact.  Teeth in good repair.  NODES:  There are no cervical, axillary, or inguinal nodes appreciated.  CHEST:  Clear to auscultation bilaterally.  CARDIOVASCULAR:  Regular rate and rhythm.  No murmur.  No gallop.  ABDOMEN:  Obese and questionably distended, positive bowel sounds.  There is a large healing surgical abdominal wound.  EXTREMITIES:  Without  clubbing, cyanosis, edema.  NEUROLOGIC:  Alert and oriented x3.  Cranial nerves II-XII intact.  Strength is 5/5.  DTRs brisk at 2+.  LABORATORIES:  WBC 8.8, hemoglobin 9.8, hematocrit 30.3, platelets 218,000. PT 14.4, INR 1.1, PTT 38.  Sodium 140, potassium 3.4, chloride 104, CO2 27, glucose 104, BUN 13, creatinine 1.1, calcium 9.1, albumin 3.5.  Iron 13, TIBC 337, percent saturation 4.  PSA 2.16.  CEA below 0.5.  The patient will be seen by Dr. Marnee Guarneri for review of scans completed today and  to complete staging. Dictated by:   Rosemarie Ax, N.P. Attending Physician:  Garnette Scheuermann DD:  09/17/01 TD:  09/19/01 Job: [id number]

## 2010-08-11 NOTE — Discharge Summary (Signed)
[place]  Patient:    Ryan Hernandez, Ryan Hernandez Visit Number: [id number] MRN: [id number]          Service Type: MED Location: [phone] Attending Physician:  Garnette Scheuermann Dictated by:   Arlis Porta, M.D. Admit Date:  09/08/2001 Discharge Date: 09/17/2001   CC:         Jimmye Norman, M.D.  Dr. Katrinka Blazing  Dr. Andee Poles, [facility]   Discharge Summary  CONSULTATIONS: 1. Dr. Marina Goodell, gastroenterology. 2. Dr. Lindie Spruce, surgery. 3. Dr. Katrinka Blazing, oncology.  DISCHARGE DIAGNOSES: 1. Lower gastrointestinal bleeding secondary to right colon cancer, status    post right hemicolectomy. 2. Hypertension. 3. Hyperlipidemia.  PROCEDURES: 1. Colonoscopy which showed a right-sided colon cancer. 2. Right hemicolectomy performed by Dr. Lindie Spruce with biopsy of his colon cancer.    There were no complications postoperatively.  LABORATORY DATA AND X-RAY FINDINGS:  The patient underwent chest x-ray on admission which showed cardiac enlargement without acute pulmonary process. CT of the chest showed no evidence of metastatic disease, right upper lobe infiltrate, tiny right pleural effusion.  CT of the abdomen showed no evidence of abdominal metastatic disease.  Expected postoperative changes from right hemicolectomy.  CT of the pelvis showed a small amount of free pelvic fluid, likely postoperative.  No evidence of pelvic or metastatic disease.  CBC at discharge showed a white blood cell count of 7.1, hemoglobin 9.6, hematocrit 29.1, platelet count 346.  BMET at discharge showed a sodium of 142, potassium 4.2, chloride 103, CO2 31, BUN 6, creatinine 0.9, glucose 112, calcium 8.7.  Urine culture showed no growth.  Blood cultures x2 showed no growth.  CEA preoperatively was less than 0.5.  Iron studies showed an iron of 13, total iron binding capacity of 337 and percent saturation of 4.  Ferritin level was low at 7.  Coagulation studies showed a PTT of  38, PT of 14.4 and an INR of 1.1.  PSA was in the normal range at 2.16.  Serum amylase was 62.  LFTs on admission showed SGOT of 19, SGPT of 22, Alk phos of 63, total bilirubin 1.1, total protein 6.8 and an albumin of 3.5.  Surgical pathology on the tumor biopsy showed a 6 cm tumor consistent with adenocarcinoma that was moderately to poorly differentiated.  Margins were negative.  Depth of invasion was through the muscularis propia, 0/17 lymph nodes positive for tumor metastases.  HOSPITAL COURSE:  #1 - RIGHT COLON ADENOCARCINOMA:  The patient is a 65 year old, white male who presented with lower GI bleeding and a decreased hemoglobin when he presented to his primary care physician.  Please see admission History and Physical for details.  Essentially, he was checked with serial CBCs.  He remained stable enough to have a colonoscopy performed which found the adenocarcinoma.  He was transfused with two units of packed red blood cells and preparation for what was eventually a right-sided hemicolectomy.  The patient tolerated the procedure well with no intraoperative or postoperative complications.  By the time of discharge, the patient was tolerating a regular diet and had normal bowel movements.  The patient remained afebrile for at least 48 hours prior to discharge.  The patients incision looked clean, dry and intact as well.  #2 - HYPERTENSION:  The patient was controlled on his regular home medications.  Postoperatively, he was treated with Catapres that he could take p.o.  His medications can be further titrated as an outpatient.  #3 - HYPERLIPIDEMIA:  The patient was maintained on his home dose of Lipitor while in the hospital.  This can be further followed as an outpatient.  #4 - HISTORY OF PROSTATITIS:  The patient had a history of elevated PSA which did come back to normal after treatment for prostatitis as an outpatient.  PSA checked in the hospital was within normal range and  can be further monitored as an outpatient.  DISCHARGE MEDICATIONS: 1. Lipitor 10 mg p.o. q.h.s. 2. Hyzaar 100/25 mg p.o. q.d. 3. Norvasc 10 mg p.o. q.d. 4. Tylox or Tylenol p.r.n.  ACTIVITY:  No lifting or strenuous activity.  DIET:  No restrictions.  WOUND CARE:  The patient may shower.  FOLLOWUP:  Dr. Lonn Georgia office will call the patient with an appointment or he can call [phone], for questions or concerns.  He should also make an appointment to see Dr. Lindie Spruce in approximately one week.  He can call [phone], for an appointment. Dictated by:   Arlis Porta, M.D. Attending Physician:  Garnette Scheuermann DD:  09/17/01 TD:  09/19/01 Job: [id number]

## 2010-08-11 NOTE — Op Note (Signed)
[place]. [facility]  Patient:    JAVEL, HERSH Visit Number: [id number] MRN: [id number]          Service Type: MED Location: [phone] Attending Physician:  Garnette Scheuermann Dictated by:   Jimmye Norman, M.D. Proc. Date: 09/12/01 Admit Date:  09/08/2001                             Operative Report  PREOPERATIVE DIAGNOSIS:  Ascending colon carcinoma with anemia.  POSTOPERATIVE DIAGNOSIS:  Ascending colon carcinoma with anemia.  PROCEDURE:  Right hemicolectomy.  SURGEON:  Jimmye Norman, M.D.  ASSISTANT:  Eugenia Pancoast, P.A.  ANESTHESIA:  General endotracheal.  ESTIMATED BLOOD LOSS:  Less than 100 cc.  COMPLICATIONS:  None.  COMPLICATIONS:  None.  INDICATION FOR OPERATION:  The patient is a 65 year old gentleman recently admitted with a lower GI bleed.  Colonoscopy demonstrated a large tumor of the right colon.  He is now getting a right colectomy.  FINDINGS:  The patient had no evidence of metastasis.  The tumor was big and bulky, perhaps with some mesenteric lymphadenopathy, but the liver did not appear to have any palpable metastatic lesions.  There were two small cystic lesions on the anterior surface of the right lobe and the left lobe, respectively, but they did not appear to be metastatic.  DESCRIPTION OF PROCEDURE:  The patient was taken to the operating room and placed on the table in the supine position.  After an adequate endotracheal anesthetic was administered, he was prepped and draped in the usual sterile manner, exposing the midline of the abdomen.  The A midline incision was made from just below the xiphoid down to the left of the umbilicus.  It was taken down to and through the midline fascia using electrocautery and once we were in the peritoneal cavity, we could freely palpate around and could palpate the lesion at the hepatic flexure.  We placed a Thompson bar retractor in place in order to expose the right  upper quadrant.  We then took down the lateral peritoneal attachments to the right colon in order to medially rotate the right colon.  We also took down the omentum from the colon at the midportion of the transverse colon in order to mobilize this portion of the colon.  We took a GIA-75 stapler across the mid-transverse colon just to the right of the middle colic vessels.  We also took the terminal ileum using a GIA-75 stapler, approximately 10 cm proximal to the terminal ileum.  The mesentery was scored using electrocautery.  The mesenteric vessels were taken between [facility] clamps and 2-0 silk ties.  The major ileocolic vessel was taken using an 0 silk tie, doubly ligated.  Once the specimen was removed, we irrigated and found there to be adequate hemostasis in the right upper quadrant.  Then we performed a functional end-to-end side-to-side anastomosis between the terminal ileum and the mid-transverse colon using a GIA-75 stapler.  The resulting enterotomy was closed using a TA-60 stapler wit 3.5 mm staples.  The mesenteric defect was closed using interrupted figure-of-eight stitches of 2-0 silk.  Once this was done, we looked again, observed again for adequate hemostasis, found there to be good hemostasis, and then closed.  All lap tapes were removed from the abdominal wall.  We irrigated with saline.  Then the fascia was closed using running #1 PDS suture.  Once this was done, the subcu was  irrigated with saline, then the skin was closed using stainless steel stapes.  All needle counts, sponge counts, and instrument counts were correct.  A sterile dressing was applied. Dictated by:   Jimmye Norman, M.D. Attending Physician:  Garnette Scheuermann DD:  09/12/01 TD:  09/14/01 Job: [id number]

## 2010-10-10 ENCOUNTER — Encounter: Payer: Self-pay | Admitting: Internal Medicine

## 2010-12-05 ENCOUNTER — Encounter: Payer: Self-pay | Admitting: Internal Medicine

## 2011-01-04 ENCOUNTER — Ambulatory Visit (AMBULATORY_SURGERY_CENTER): Payer: Medicare Other | Admitting: *Deleted

## 2011-01-04 VITALS — Ht 70.0 in | Wt 240.8 lb

## 2011-01-04 DIAGNOSIS — Z85038 Personal history of other malignant neoplasm of large intestine: Secondary | ICD-10-CM

## 2011-01-04 MED ORDER — PEG-KCL-NACL-NASULF-NA ASC-C 100 G PO SOLR: 1.0000 | Freq: Once | ORAL | Status: DC

## 2011-01-18 ENCOUNTER — Ambulatory Visit (AMBULATORY_SURGERY_CENTER): Payer: Medicare Other | Admitting: Internal Medicine

## 2011-01-18 ENCOUNTER — Encounter: Payer: Self-pay | Admitting: Internal Medicine

## 2011-01-18 VITALS — BP 118/75 | HR 69 | Temp 96.4°F | Resp 16 | Ht 70.0 in | Wt 240.0 lb

## 2011-01-18 DIAGNOSIS — Z85038 Personal history of other malignant neoplasm of large intestine: Secondary | ICD-10-CM

## 2011-01-18 DIAGNOSIS — Z1211 Encounter for screening for malignant neoplasm of colon: Secondary | ICD-10-CM

## 2011-01-18 DIAGNOSIS — D126 Benign neoplasm of colon, unspecified: Secondary | ICD-10-CM

## 2011-01-18 MED ORDER — SODIUM CHLORIDE 0.9 % IV SOLN: 500.0000 mL | INTRAVENOUS | Status: DC

## 2011-01-18 NOTE — Patient Instructions (Addendum)
Handouts given on diverticulosis, high fiber diet, polyps  Repeat exam in 5 years-2017- we will mail you a letter to remind you to schedule this  Discharge instructions per blue and green sheets  miralax over the counter per dr Marina Goodell as directed on the bottle once a day

## 2011-01-19 ENCOUNTER — Telehealth: Payer: Self-pay

## 2011-01-19 NOTE — Telephone Encounter (Signed)
No ID on answering machine. 

## 2011-03-29 DIAGNOSIS — I10 Essential (primary) hypertension: Secondary | ICD-10-CM | POA: Diagnosis not present

## 2011-03-29 DIAGNOSIS — I2510 Atherosclerotic heart disease of native coronary artery without angina pectoris: Secondary | ICD-10-CM | POA: Diagnosis not present

## 2011-03-29 DIAGNOSIS — E669 Obesity, unspecified: Secondary | ICD-10-CM | POA: Diagnosis not present

## 2011-03-29 DIAGNOSIS — E785 Hyperlipidemia, unspecified: Secondary | ICD-10-CM | POA: Diagnosis not present

## 2011-04-26 ENCOUNTER — Ambulatory Visit: Payer: Self-pay | Admitting: Emergency Medicine

## 2011-05-14 ENCOUNTER — Other Ambulatory Visit: Payer: Self-pay | Admitting: Physician Assistant

## 2011-05-20 ENCOUNTER — Other Ambulatory Visit: Payer: Self-pay | Admitting: Internal Medicine

## 2011-05-20 MED ORDER — ALPRAZOLAM 0.5 MG PO TABS: 0.5000 mg | ORAL_TABLET | Freq: Two times a day (BID) | ORAL | Status: DC

## 2011-05-22 ENCOUNTER — Ambulatory Visit (INDEPENDENT_AMBULATORY_CARE_PROVIDER_SITE_OTHER): Payer: Medicare Other | Admitting: Emergency Medicine

## 2011-05-22 DIAGNOSIS — G4733 Obstructive sleep apnea (adult) (pediatric): Secondary | ICD-10-CM

## 2011-05-22 DIAGNOSIS — R972 Elevated prostate specific antigen [PSA]: Secondary | ICD-10-CM | POA: Diagnosis not present

## 2011-05-22 DIAGNOSIS — R739 Hyperglycemia, unspecified: Secondary | ICD-10-CM

## 2011-05-22 DIAGNOSIS — R7309 Other abnormal glucose: Secondary | ICD-10-CM | POA: Diagnosis not present

## 2011-05-22 DIAGNOSIS — E785 Hyperlipidemia, unspecified: Secondary | ICD-10-CM

## 2011-05-22 DIAGNOSIS — E782 Mixed hyperlipidemia: Secondary | ICD-10-CM | POA: Diagnosis not present

## 2011-05-22 DIAGNOSIS — G4730 Sleep apnea, unspecified: Secondary | ICD-10-CM | POA: Diagnosis not present

## 2011-05-22 DIAGNOSIS — I2510 Atherosclerotic heart disease of native coronary artery without angina pectoris: Secondary | ICD-10-CM | POA: Diagnosis not present

## 2011-05-22 LAB — POCT GLYCOSYLATED HEMOGLOBIN (HGB A1C): Hemoglobin A1C: 6

## 2011-05-22 LAB — GLUCOSE, POCT (MANUAL RESULT ENTRY): POC Glucose: 83

## 2011-05-22 NOTE — Progress Notes (Signed)
  Subjective:    Patient ID: Ryan Hernandez, male    DOB: 16-Nov-1945, 66 y.o.   MRN: [id number]  HPI patient and recheck of his multiple medical problems. His blood pressure high cholesterol as well as a history of colon cancer. He enters today for recheck the he's been unable to use his machine for sleep apnea and it improper fittings. Patient is requesting repeat testing to see the status of his sleep apnea.    Review of Systems  Constitutional: Negative.   HENT: Negative.   Eyes: Negative.   Respiratory:       Recent problems waking up due to sleep apnea. He is waking up gasping for breath  Cardiovascular: Negative.   Gastrointestinal:       History: CA of the colon  Genitourinary: Negative.   Musculoskeletal: Negative.   Neurological: Negative.   Hematological: Negative.   Psychiatric/Behavioral: Negative.        Objective:   Physical Exam HEENT exam is within normal limits. Neck supple chest clear heart regular rate no murmurs abdomen soft no tenderness        Assessment & Plan:  Patient is stable at the present time we'll check his routine labs.Order a repeat sleep study.

## 2011-05-23 LAB — LIPID PANEL
Cholesterol: 248 mg/dL — ABNORMAL HIGH (ref 0–200)
LDL Cholesterol: 146 mg/dL — ABNORMAL HIGH (ref 0–99)
Total CHOL/HDL Ratio: 6.5 Ratio
VLDL: 64 mg/dL — ABNORMAL HIGH (ref 0–40)

## 2011-05-23 LAB — CBC WITH DIFFERENTIAL/PLATELET
Basophils Absolute: 0.1 10*3/uL (ref 0.0–0.1)
Eosinophils Relative: 5 % (ref 0–5)
Lymphocytes Relative: 47 % — ABNORMAL HIGH (ref 12–46)
MCV: 90.3 fL (ref 78.0–100.0)
Neutrophils Relative %: 35 % — ABNORMAL LOW (ref 43–77)
Platelets: 240 10*3/uL (ref 150–400)
RDW: 13.9 % (ref 11.5–15.5)
WBC: 4.6 10*3/uL (ref 4.0–10.5)

## 2011-05-23 LAB — COMPREHENSIVE METABOLIC PANEL
ALT: 25 U/L (ref 0–53)
AST: 18 U/L (ref 0–37)
Alkaline Phosphatase: 49 U/L (ref 39–117)
Creat: 0.99 mg/dL (ref 0.50–1.35)
Sodium: 141 mEq/L (ref 135–145)
Total Bilirubin: 1.2 mg/dL (ref 0.3–1.2)
Total Protein: 7 g/dL (ref 6.0–8.3)

## 2011-05-23 LAB — PSA: PSA: 4.29 ng/mL — ABNORMAL HIGH (ref <=4.00)

## 2011-06-14 ENCOUNTER — Other Ambulatory Visit: Payer: Self-pay | Admitting: Emergency Medicine

## 2011-06-18 ENCOUNTER — Other Ambulatory Visit: Payer: Self-pay | Admitting: Emergency Medicine

## 2011-06-21 DIAGNOSIS — G4733 Obstructive sleep apnea (adult) (pediatric): Secondary | ICD-10-CM | POA: Diagnosis not present

## 2011-10-15 DIAGNOSIS — G4726 Circadian rhythm sleep disorder, shift work type: Secondary | ICD-10-CM | POA: Diagnosis not present

## 2011-10-15 DIAGNOSIS — G4733 Obstructive sleep apnea (adult) (pediatric): Secondary | ICD-10-CM | POA: Diagnosis not present

## 2011-11-15 ENCOUNTER — Telehealth: Payer: Self-pay

## 2011-11-15 MED ORDER — LOSARTAN POTASSIUM-HCTZ 100-25 MG PO TABS: 1.0000 | ORAL_TABLET | Freq: Every day | ORAL | Status: DC

## 2011-11-15 MED ORDER — AMLODIPINE BESYLATE 10 MG PO TABS: 10.0000 mg | ORAL_TABLET | Freq: Every day | ORAL | Status: DC

## 2011-11-15 NOTE — Telephone Encounter (Signed)
Pt came into 102 stating that his pharmacy was supposed to have sent over a req for his BP meds over a month ago and he still doesn't have them and he has been w/out them. Explained to pt that we have no record of a req, but that we can RF both of his BP meds for a month and then he is due for a check up. Pt agreed and I am sending BP meds to [facility].

## 2011-11-15 NOTE — Telephone Encounter (Signed)
Pt could not get an appt w/Dr Daub until 03/04/12. Since pt's BP was well controlled at last OV, Heather OK'd RFs through appt. Changed Rxs to include 3 RFs each.

## 2011-11-15 NOTE — Addendum Note (Signed)
Addended by: Sheppard Plumber A on: 11/15/2011 01:20 PM   Modules accepted: Orders

## 2011-11-29 DIAGNOSIS — N401 Enlarged prostate with lower urinary tract symptoms: Secondary | ICD-10-CM | POA: Diagnosis not present

## 2011-11-29 DIAGNOSIS — R972 Elevated prostate specific antigen [PSA]: Secondary | ICD-10-CM | POA: Diagnosis not present

## 2011-12-10 ENCOUNTER — Other Ambulatory Visit: Payer: Self-pay | Admitting: Emergency Medicine

## 2012-01-14 DIAGNOSIS — I2510 Atherosclerotic heart disease of native coronary artery without angina pectoris: Secondary | ICD-10-CM | POA: Diagnosis not present

## 2012-01-14 DIAGNOSIS — E669 Obesity, unspecified: Secondary | ICD-10-CM | POA: Diagnosis not present

## 2012-01-14 DIAGNOSIS — I10 Essential (primary) hypertension: Secondary | ICD-10-CM | POA: Diagnosis not present

## 2012-01-14 DIAGNOSIS — E785 Hyperlipidemia, unspecified: Secondary | ICD-10-CM | POA: Diagnosis not present

## 2012-02-15 ENCOUNTER — Other Ambulatory Visit: Payer: Self-pay | Admitting: Emergency Medicine

## 2012-02-15 ENCOUNTER — Other Ambulatory Visit: Payer: Self-pay | Admitting: Physician Assistant

## 2012-02-15 NOTE — Telephone Encounter (Signed)
Please pull chart - not rx'ed in [facility]

## 2012-02-17 ENCOUNTER — Telehealth: Payer: Self-pay | Admitting: Family Medicine

## 2012-02-17 NOTE — Telephone Encounter (Signed)
Called in alprazolam 0.5mg  1 po bid #60 0 refills [facility]. Left on pharmacy voice mail

## 2012-02-22 ENCOUNTER — Other Ambulatory Visit: Payer: Self-pay | Admitting: Radiology

## 2012-02-22 MED ORDER — MELOXICAM 7.5 MG PO TABS: 7.5000 mg | ORAL_TABLET | Freq: Every day | ORAL | Status: DC

## 2012-03-04 ENCOUNTER — Encounter: Payer: Self-pay | Admitting: Emergency Medicine

## 2012-03-04 ENCOUNTER — Ambulatory Visit (INDEPENDENT_AMBULATORY_CARE_PROVIDER_SITE_OTHER): Payer: Medicare Other | Admitting: Emergency Medicine

## 2012-03-04 VITALS — BP 137/82 | HR 68 | Temp 97.7°F | Resp 16 | Ht 69.5 in | Wt 243.0 lb

## 2012-03-04 DIAGNOSIS — I10 Essential (primary) hypertension: Secondary | ICD-10-CM | POA: Diagnosis not present

## 2012-03-04 DIAGNOSIS — C189 Malignant neoplasm of colon, unspecified: Secondary | ICD-10-CM | POA: Diagnosis not present

## 2012-03-04 DIAGNOSIS — E785 Hyperlipidemia, unspecified: Secondary | ICD-10-CM

## 2012-03-04 DIAGNOSIS — E119 Type 2 diabetes mellitus without complications: Secondary | ICD-10-CM

## 2012-03-04 DIAGNOSIS — Z23 Encounter for immunization: Secondary | ICD-10-CM | POA: Diagnosis not present

## 2012-03-04 DIAGNOSIS — F419 Anxiety disorder, unspecified: Secondary | ICD-10-CM

## 2012-03-04 DIAGNOSIS — Z0000 Encounter for general adult medical examination without abnormal findings: Secondary | ICD-10-CM | POA: Diagnosis not present

## 2012-03-04 DIAGNOSIS — G4700 Insomnia, unspecified: Secondary | ICD-10-CM | POA: Insufficient documentation

## 2012-03-04 DIAGNOSIS — Z85038 Personal history of other malignant neoplasm of large intestine: Secondary | ICD-10-CM | POA: Insufficient documentation

## 2012-03-04 DIAGNOSIS — I2510 Atherosclerotic heart disease of native coronary artery without angina pectoris: Secondary | ICD-10-CM

## 2012-03-04 DIAGNOSIS — G4733 Obstructive sleep apnea (adult) (pediatric): Secondary | ICD-10-CM | POA: Insufficient documentation

## 2012-03-04 DIAGNOSIS — N400 Enlarged prostate without lower urinary tract symptoms: Secondary | ICD-10-CM | POA: Insufficient documentation

## 2012-03-04 LAB — CBC WITH DIFFERENTIAL/PLATELET
HCT: 43.9 % (ref 39.0–52.0)
Hemoglobin: 15.4 g/dL (ref 13.0–17.0)
Lymphocytes Relative: 42 % (ref 12–46)
Lymphs Abs: 2.7 10*3/uL (ref 0.7–4.0)
Monocytes Absolute: 0.9 10*3/uL (ref 0.1–1.0)
Monocytes Relative: 14 % — ABNORMAL HIGH (ref 3–12)
Neutro Abs: 2.6 10*3/uL (ref 1.7–7.7)
WBC: 6.5 10*3/uL (ref 4.0–10.5)

## 2012-03-04 LAB — COMPREHENSIVE METABOLIC PANEL
AST: 26 U/L (ref 0–37)
Albumin: 4.6 g/dL (ref 3.5–5.2)
BUN: 17 mg/dL (ref 6–23)
Calcium: 10 mg/dL (ref 8.4–10.5)
Chloride: 103 mEq/L (ref 96–112)
Glucose, Bld: 72 mg/dL (ref 70–99)
Potassium: 3.7 mEq/L (ref 3.5–5.3)

## 2012-03-04 LAB — POCT UA - MICROSCOPIC ONLY
Bacteria, U Microscopic: NEGATIVE
Casts, Ur, LPF, POC: NEGATIVE
Crystals, Ur, HPF, POC: NEGATIVE

## 2012-03-04 LAB — POCT URINALYSIS DIPSTICK
Bilirubin, UA: NEGATIVE
Ketones, UA: NEGATIVE

## 2012-03-04 LAB — LIPID PANEL
HDL: 34 mg/dL — ABNORMAL LOW (ref >39)
Triglycerides: 385 mg/dL — ABNORMAL HIGH (ref <150)

## 2012-03-04 NOTE — Progress Notes (Signed)
  Subjective:    Patient ID: Ryan Hernandez, male    DOB: 30-Apr-1945, 66 y.o.   MRN: [id number]  HPI patient here for physical exam. Basically has a history of colon cancer. He has a history of BPH and is followed by the urologist. He also is followed regularly by his cardiologist . He is a patient of Dr. Viann Fish. His GI doctors Dr. Wendie Agreste and he is up-to-date on his colonoscopies and has had no signs of colon cancer recurrence.    Review of Systems  HENT: Positive for hearing loss, dental problem and tinnitus.   Respiratory: Positive for apnea.   Genitourinary: Positive for difficulty urinating.  Musculoskeletal: Positive for myalgias, back pain, arthralgias and gait problem.   he has a history of long-standing hearing loss but is not ready to get hearing aids he has difficulty with apnea but this is treated with CPAP machine at 6 cm mercury. His difficulty with urination and is under the care of Dr. Vernie Ammons.          Objective:   Physical Exam HEENT exam TMs are clear the nose is normal posterior pharynx is clear there is a 0.5 x 0.5 cm raised area over the left jaw. Patient states this has been there for 20 years and he has seen a dermatologist about it. Neck is supple. There are no carotid bruits. Chest is clear to auscultation and percussion. Cardiac exam reveals regular rate and rhythm with no murmurs rubs or gallops. The abdomen is flat there is a large midline scar there are no masses felt extremities are without without cyanosis clubbing or edema. Rectal exam was not performed because patient sees the urologist regular. He initially was not performed in that he has just seen his GI doctor who follows him closely for his colon cancer. EKG was not read. Because he is seeing his cardiologist this year.        Assessment & Plan:    The patient is stable at present. He is somewhat overwhelmed by his wife's illness. He also is overwhelmed  financially. He is depressed over his  financial situation and his wife's illness. Otherwise all medical issues are currently stable at present I have asked him to return in 6 months

## 2012-03-05 LAB — CEA: CEA: 1.2 ng/mL (ref 0.0–5.0)

## 2012-03-18 ENCOUNTER — Telehealth: Payer: Self-pay

## 2012-03-18 NOTE — Telephone Encounter (Signed)
Patient states that he was in to seen Dr Cleta Alberts last week for his CPE and Dr Cleta Alberts was going to send refills on all his medications.  Patient states that he called the Pharmacy and they stated that we never sent the prescriptions in to them.  Patient states that he is out of his BP Medications.   Pharmacy: [facility] at [facility]    Best#: [phone]

## 2012-03-20 ENCOUNTER — Other Ambulatory Visit: Payer: Self-pay | Admitting: Emergency Medicine

## 2012-03-20 DIAGNOSIS — F419 Anxiety disorder, unspecified: Secondary | ICD-10-CM

## 2012-03-20 DIAGNOSIS — E785 Hyperlipidemia, unspecified: Secondary | ICD-10-CM

## 2012-03-20 DIAGNOSIS — I10 Essential (primary) hypertension: Secondary | ICD-10-CM

## 2012-03-20 DIAGNOSIS — N400 Enlarged prostate without lower urinary tract symptoms: Secondary | ICD-10-CM

## 2012-03-20 DIAGNOSIS — I2510 Atherosclerotic heart disease of native coronary artery without angina pectoris: Secondary | ICD-10-CM

## 2012-03-20 DIAGNOSIS — G4700 Insomnia, unspecified: Secondary | ICD-10-CM

## 2012-03-20 MED ORDER — ALPRAZOLAM 0.5 MG PO TABS: 0.5000 mg | ORAL_TABLET | Freq: Two times a day (BID) | ORAL | Status: DC

## 2012-03-20 MED ORDER — NITROGLYCERIN 0.4 MG SL SUBL: 0.4000 mg | SUBLINGUAL_TABLET | SUBLINGUAL | Status: AC | PRN

## 2012-03-20 MED ORDER — AMLODIPINE BESYLATE 10 MG PO TABS: 10.0000 mg | ORAL_TABLET | Freq: Every day | ORAL | Status: DC

## 2012-03-20 MED ORDER — ATORVASTATIN CALCIUM 40 MG PO TABS: 40.0000 mg | ORAL_TABLET | Freq: Every day | ORAL | Status: DC

## 2012-03-20 MED ORDER — ZOLPIDEM TARTRATE 10 MG PO TABS: 10.0000 mg | ORAL_TABLET | Freq: Every evening | ORAL | Status: DC | PRN

## 2012-03-20 MED ORDER — TAMSULOSIN HCL 0.4 MG PO CAPS: 0.4000 mg | ORAL_CAPSULE | Freq: Every day | ORAL | Status: DC

## 2012-03-20 NOTE — Telephone Encounter (Signed)
We'll go ahead and refill medications he is lacking.

## 2012-03-20 NOTE — Telephone Encounter (Signed)
He stated that he had other meds as well.

## 2012-04-25 ENCOUNTER — Other Ambulatory Visit: Payer: Self-pay | Admitting: Emergency Medicine

## 2012-04-25 ENCOUNTER — Other Ambulatory Visit: Payer: Self-pay | Admitting: Physician Assistant

## 2012-05-26 ENCOUNTER — Other Ambulatory Visit: Payer: Self-pay | Admitting: Emergency Medicine

## 2012-05-26 ENCOUNTER — Other Ambulatory Visit: Payer: Self-pay | Admitting: Physician Assistant

## 2012-06-03 ENCOUNTER — Other Ambulatory Visit: Payer: Self-pay | Admitting: Emergency Medicine

## 2012-06-03 DIAGNOSIS — I10 Essential (primary) hypertension: Secondary | ICD-10-CM

## 2012-06-03 MED ORDER — AMLODIPINE BESYLATE 10 MG PO TABS
10.0000 mg | ORAL_TABLET | Freq: Every day | ORAL | Status: DC
Start: 2012-06-03 — End: 2013-06-19

## 2012-06-26 ENCOUNTER — Other Ambulatory Visit: Payer: Self-pay | Admitting: Emergency Medicine

## 2012-06-27 ENCOUNTER — Encounter: Payer: Self-pay | Admitting: Emergency Medicine

## 2012-07-29 ENCOUNTER — Other Ambulatory Visit: Payer: Self-pay | Admitting: Emergency Medicine

## 2012-07-29 ENCOUNTER — Telehealth: Payer: Self-pay

## 2012-07-29 NOTE — Telephone Encounter (Signed)
RX for alprazolam 0.5 mg tablet twice daily as needed #60, 0RF Called into pharmacy

## 2012-09-09 ENCOUNTER — Ambulatory Visit (INDEPENDENT_AMBULATORY_CARE_PROVIDER_SITE_OTHER): Payer: Medicare Other | Admitting: Emergency Medicine

## 2012-09-09 ENCOUNTER — Encounter: Payer: Self-pay | Admitting: Emergency Medicine

## 2012-09-09 VITALS — BP 120/68 | HR 67 | Temp 97.9°F | Resp 16 | Ht 69.0 in | Wt 241.4 lb

## 2012-09-09 DIAGNOSIS — M1990 Unspecified osteoarthritis, unspecified site: Secondary | ICD-10-CM

## 2012-09-09 DIAGNOSIS — R7309 Other abnormal glucose: Secondary | ICD-10-CM

## 2012-09-09 DIAGNOSIS — Z79899 Other long term (current) drug therapy: Secondary | ICD-10-CM | POA: Diagnosis not present

## 2012-09-09 DIAGNOSIS — E785 Hyperlipidemia, unspecified: Secondary | ICD-10-CM | POA: Diagnosis not present

## 2012-09-09 DIAGNOSIS — F439 Reaction to severe stress, unspecified: Secondary | ICD-10-CM

## 2012-09-09 LAB — COMPREHENSIVE METABOLIC PANEL
AST: 18 U/L (ref 0–37)
Alkaline Phosphatase: 43 U/L (ref 39–117)
BUN: 25 mg/dL — ABNORMAL HIGH (ref 6–23)
Calcium: 9.5 mg/dL (ref 8.4–10.5)
Creat: 1.05 mg/dL (ref 0.50–1.35)
Glucose, Bld: 118 mg/dL — ABNORMAL HIGH (ref 70–99)

## 2012-09-09 LAB — LIPID PANEL
HDL: 36 mg/dL — ABNORMAL LOW (ref >39)
Total CHOL/HDL Ratio: 5.4 Ratio
Triglycerides: 207 mg/dL — ABNORMAL HIGH (ref <150)

## 2012-09-09 LAB — CBC WITH DIFFERENTIAL/PLATELET
Basophils Relative: 2 % — ABNORMAL HIGH (ref 0–1)
Eosinophils Absolute: 0.1 10*3/uL (ref 0.0–0.7)
MCH: 32.1 pg (ref 26.0–34.0)
MCHC: 34.3 g/dL (ref 30.0–36.0)
Neutrophils Relative %: 45 % (ref 43–77)
Platelets: 259 10*3/uL (ref 150–400)
RBC: 4.68 MIL/uL (ref 4.22–5.81)

## 2012-09-09 LAB — POCT GLYCOSYLATED HEMOGLOBIN (HGB A1C): Hemoglobin A1C: 6

## 2012-09-09 MED ORDER — ALPRAZOLAM 0.5 MG PO TABS: ORAL_TABLET | ORAL | Status: DC

## 2012-09-09 MED ORDER — MELOXICAM 7.5 MG PO TABS: ORAL_TABLET | ORAL | Status: DC

## 2012-09-09 NOTE — Progress Notes (Signed)
  Subjective:    Patient ID: Ryan Hernandez, male    DOB: [birth year], 67 y.o.   MRN: [id number]  HPI patient in followup hyperlipidemia hypertension and anxiety. He is not having to take his pain medication but about once a day. He takes Xanax about one a day. He rarely takes his Ambien. Things are overall doing well and has no specific complaints today except for pain in his left knee and ankle   Review of Systems     Objective:   Physical Exam patient is alert gentleman in no distress. His neck is supple. His chest is clear. Heart regular rate no murmurs. Abdomen soft nontender. Extremities are without edema.        Assessment & Plan:  Patient stable in present no change in medications planned at the present time.

## 2012-10-29 ENCOUNTER — Other Ambulatory Visit: Payer: Self-pay

## 2012-11-02 ENCOUNTER — Other Ambulatory Visit: Payer: Self-pay | Admitting: Emergency Medicine

## 2012-11-03 NOTE — Telephone Encounter (Signed)
Alprazolam was Rxd 09/09/12 #60 w/5RFs, shouldn't need it. I have pended zolpidem Rx.

## 2012-12-18 DIAGNOSIS — Z23 Encounter for immunization: Secondary | ICD-10-CM | POA: Diagnosis not present

## 2013-01-07 ENCOUNTER — Other Ambulatory Visit: Payer: Self-pay | Admitting: Emergency Medicine

## 2013-01-13 ENCOUNTER — Encounter: Payer: Self-pay | Admitting: Cardiology

## 2013-01-13 DIAGNOSIS — I2510 Atherosclerotic heart disease of native coronary artery without angina pectoris: Secondary | ICD-10-CM

## 2013-01-13 DIAGNOSIS — E669 Obesity, unspecified: Secondary | ICD-10-CM | POA: Diagnosis not present

## 2013-01-13 DIAGNOSIS — E785 Hyperlipidemia, unspecified: Secondary | ICD-10-CM | POA: Diagnosis not present

## 2013-01-13 DIAGNOSIS — I10 Essential (primary) hypertension: Secondary | ICD-10-CM | POA: Diagnosis not present

## 2013-01-13 NOTE — Progress Notes (Unsigned)
Patient ID: Ryan Hernandez, male   DOB: 12/21/1945, 67 y.o.   MRN: [id number]  Ryan Hernandez, Ryan Hernandez    Date of visit:  01/13/2013 DOB:  [birth year]    Age:  67 yrs. Medical record number:  [id number]     Account number:  [id number] Primary Care Provider: Lesle Chris A ____________________________ CURRENT DIAGNOSES  1. CAD,Native  2. Hypertension,Essential (Benign)  3. Hyperlipidemia  4. Obesity(BMI30-40)  5. Surgery-PTCA ____________________________ ALLERGIES  Ace Inhibitors, Intolerance-cough ____________________________ MEDICATIONS  1. nitroglycerin 0.4 mg tablet, sublingual, PRN  2. Miralax 17 gram Powder in Packet, 1 p.o. daily  3. alprazolam 0.5 mg tablet, PRN  4. tamsulosin 0.4 mg capsule,extended release 24hr, 1 p.o. daily  5. meloxicam 7.5 mg tablet, PRN  6. amlodipine 10 mg tablet, 1 p.o. daily  7. Advil PM 200 mg-38 mg tablet, 2 qhs  8. zolpidem 10 mg tablet, hs prn  9. atorvastatin 40 mg tablet, 1 p.o. daily  10. losartan 100 mg-hydrochlorothiazide 25 mg tablet, 1 p.o. daily  11. aspirin 81 mg chewable tablet, 1 p.o. daily  12. aspirin 81 mg chewable tablet, 1 p.o. daily ____________________________ CHIEF COMPLAINTS  Followup of CAD,Native  Followup of Hyperlipidemia ____________________________ HISTORY OF PRESENT ILLNESS Patient seen for cardiac followup. He has had a good year since he was previously here. He is exercising on a regular basis and his lipids were checked today and show good control. He denies angina and has no PND, orthopnea, syncope, palpitations, or claudication. He stopped taking his aspirin for unclear reasons earlier this year. He has gained additional weight and needs solution additional weight. 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 ____________________________ PAST HISTORY  Past Medical Illnesses:  hypertension, hyperlipidemia, colon cancer treated with surgery, prostatitis, diverticulosis, obesity, history of UTI, BPH;  Cardiovascular Illnesses:  CAD;   Surgical Procedures:  appendectomy, colectomy-partial, tonsillectomy;  Cardiology Procedures-Invasive:  cardiac cath (left) November 2007, PTCA of the Diag 1  Dr. Elease Hashimoto 11/07;  Cardiology Procedures-Noninvasive:  treadmill December 2007;  Cardiac Cath Results:  normal Left main, 90% stenosis proximal Diag 1 followed by aneurysm, 95% stenosis OM 2, 40% stenosis distal RCA;  LVEF of 60% documented via cardiac cath on 02/11/2006,   ____________________________ CARDIO-PULMONARY TEST DATES EKG Date:  01/14/2012;   Cardiac Cath Date:  02/11/2006;  Stent Placement Date: 02/11/2006;   ____________________________ FAMILY HISTORY Brother -- Brother alive and well Brother -- Brother alive and well Father -- Father dead, Congestive heart failure Mother -- Mother dead, Hypertension ____________________________ SOCIAL HISTORY Alcohol Use:  no alcohol use;  Smoking:  used to smoke but quit 1984;  Diet:  regular diet;  Lifestyle:  married;  Exercise:  walking;  Occupation:  Security guard route and dispatcher [employer];  Illicit Drug Use:  denies substance abuse;  Residence:  lives with wife and adopted grand daughter;   ____________________________ REVIEW OF SYSTEMS General:  obesity, weight gain of approximately 5 lbs Eyes: wears eye glasses/contact lenses Respiratory: denies dyspnea, cough, wheezing or hemoptysis. Cardiovascular:  please review HPI Abdominal: intermittent constipation Genitourinary-Male: hesitancy  Musculoskeletal:  mild chronic low back pain Psychiatric:  denies depession or anxiety.  ____________________________ PHYSICAL EXAMINATION VITAL SIGNS  Blood Pressure:  134/70 Sitting, Right arm, regular cuff  , 128/70 Standing, Right arm and regular cuff   Pulse:  74/min. Weight:  239.00 lbs. Height:  70"BMI: 34  Constitutional:  pleasant white male in no acute distress, moderately obese Skin:  warm and dry to touch, no apparent skin lesions,  or masses noted. Head:  normocephalic,  balding male hair pattern ENT:  ears, nose and throat reveal no gross abnormalities.  Dentition good. Neck:  supple, no masses, thyromegaly, JVD. Carotid pulses are full and equal bilaterally without bruits. Chest:  normal symmetry, clear to auscultation and percussion. Cardiac:  regular rhythm, normal S1 and S2, No S3 or S4, no murmurs, gallops or rubs detected. Peripheral Pulses:  the femoral,dorsalis pedis, and posterior tibial pulses are full and equal bilaterally with no bruits auscultated. Extremities & Back:  no deformities, clubbing, cyanosis, erythema or edema observed. Normal muscle strength and tone. Neurological:  no gross motor or sensory deficits noted, affect appropriate, oriented x3. ____________________________ MOST RECENT LIPID PANEL 01/13/13  CHOL TOTL 158 mg/dl, LDL 84 calc, HDL 32 mg/dl, TRIGLYCER 191 mg/dl and CHOL/HDL 4.9 (Calc) ____________________________ IMPRESSIONS/PLAN  1. Coronary artery disease with previous PTCA of the diagonal branch and residual stenosis of the marginal branch and distal right coronary artery 2. Obesity with weight gain 3. Hypertension controlled 4. Hyperlipidemia currently controlled  Recommendations:  He clinically is doing well without recurrent angina. Talked again about the importance of risk factor modification and weight loss. Recommend followup in one year. Call if problems. ____________________________ TODAYS ORDERS  1. Return Visit: 1 year                       ____________________________ Cardiology Physician:  Darden Palmer MD [facility]

## 2013-01-29 ENCOUNTER — Other Ambulatory Visit: Payer: Self-pay

## 2013-02-02 ENCOUNTER — Ambulatory Visit (INDEPENDENT_AMBULATORY_CARE_PROVIDER_SITE_OTHER): Payer: Medicare Other | Admitting: Emergency Medicine

## 2013-02-02 VITALS — BP 134/62 | HR 73 | Temp 97.6°F | Resp 18 | Ht 70.0 in | Wt 238.0 lb

## 2013-02-02 DIAGNOSIS — J029 Acute pharyngitis, unspecified: Secondary | ICD-10-CM | POA: Diagnosis not present

## 2013-02-02 DIAGNOSIS — R42 Dizziness and giddiness: Secondary | ICD-10-CM

## 2013-02-02 MED ORDER — MECLIZINE HCL 25 MG PO TABS: ORAL_TABLET | ORAL | Status: DC

## 2013-02-02 NOTE — Progress Notes (Addendum)
Subjective:    Patient ID: Ryan Hernandez, male    DOB: March 14, 1946, 67 y.o.   MRN: [id number] This chart was scribed for Collene Gobble, MD by Valera Castle, ED Scribe. This patient was seen in room 5 and the patient's care was started at 9:19 AM.  HPI Ryan Hernandez is a 67 y.o. male who presents to the [facility] complaining of sudden, moderate, waxing and waning oltagia in his left ear, onset 2 days ago, as well as vertigo, onset 3-4 days ago. He states that the pain in his ear was at its worse last night, and reports an associated sore throat. He reports the vertigo feels like the room is spinning around.   He reports working 55 hours last week and 44 this week. He states they have been short of people, but that he enjoys work.  He reports having his flu vaccination. He reports his BP is good. He reports the last time he had his blood work done was when he last visited here.   He reports being healthy and has no other complaints.   Recheck BP - 120/74   Patient Active Problem List   Diagnosis Date Noted  . Obesity (BMI 30-39.9)   . Hyperlipidemia 03/04/2012  . Hypertension 03/04/2012  . CAD (coronary artery disease) 03/04/2012  . OSA on CPAP 03/04/2012  . Anxiety 03/04/2012  . History of colon cancer    Past Medical History  Diagnosis Date  . Allergy   . Anxiety   . Arthritis   . Blood transfusion   . Cancer     colon  . Heart aneurysm   . Depression   . GERD (gastroesophageal reflux disease)     hx of, no medications currently  . Hyperlipidemia   . Hypertension    Past Surgical History  Procedure Laterality Date  . Appendectomy    . Partial colectomy      colectomy  . Colonoscopy    . Fatty tumors      right arm  . Excisional hemorrhoidectomy    . Tonsillectomy     Allergies  Allergen Reactions  . Ace Inhibitors Cough   Prior to Admission medications   Medication Sig Start Date End Date Taking? Authorizing Provider  ALPRAZolam Prudy Feeler) 0.5 MG tablet TAKE ONE  TABLET BY MOUTH TWICE DAILY AS NEEDED 01/07/13  Yes Collene Gobble, MD  amLODipine (NORVASC) 10 MG tablet Take 1 tablet (10 mg total) by mouth daily. 06/03/12  Yes Collene Gobble, MD  atorvastatin (LIPITOR) 40 MG tablet Take 1 tablet (40 mg total) by mouth daily. 03/20/12  Yes Collene Gobble, MD  losartan-hydrochlorothiazide (HYZAAR) 100-25 MG per tablet Take 1 tablet by mouth daily. 11/15/11  Yes Heather M Marte, PA-C  meloxicam (MOBIC) 7.5 MG tablet TAKE ONE TO TWO TABLETS( 7.5-15 MG) BY MOUTH EVERY DAY 09/09/12  Yes Collene Gobble, MD  nitroGLYCERIN (NITROSTAT) 0.4 MG SL tablet Place 1 tablet (0.4 mg total) under the tongue every 5 (five) minutes as needed. 03/20/12  Yes Collene Gobble, MD  polyethylene glycol (MIRALAX / GLYCOLAX) packet Take 17 g by mouth daily.     Yes Historical Provider, MD  simvastatin (ZOCOR) 20 MG tablet 1 tablet Daily. 12/26/10  Yes Historical Provider, MD  Tamsulosin HCl (FLOMAX) 0.4 MG CAPS Take 1 capsule (0.4 mg total) by mouth daily after supper. 03/20/12  Yes Collene Gobble, MD  zolpidem (AMBIEN) 10 MG tablet TAKE ONE TABLET BY MOUTH EVERY DAY AT  BEDTIME AS NEEDED FOR SLEEP 11/02/12  Yes Collene Gobble, MD    Review of Systems  HENT: Positive for ear pain (left) and sore throat.   Neurological: Positive for dizziness (vertigo).  Psychiatric/Behavioral: Negative for dysphoric mood. The patient is not nervous/anxious.       Objective:   Physical Exam Nursing note and vitals reviewed. Constitutional: Pt is oriented to person, place, and time. Pt appears well-developed and well-nourished. No distress.  HENT:  Right Ear: Tympanic membrane, external ear and ear canal normal.  Left Ear Head: Normocephalic and atraumatic.  Mouth/Throat: Oropharynx is clear and moist.  Eyes: EOM are normal.  Neck: Neck supple. No tracheal deviation present.  Cardiovascular: Normal rate, regular rhythm and normal heart sounds.  Exam reveals no gallop and no friction rub.   No murmur  heard. Pulmonary/Chest: Effort normal and breath sounds normal. No respiratory distress. He has no wheezes. He has no rales.  Abdominal: Soft. Bowel sounds are normal. There is no tenderness. There is no rebound and no guarding.  Musculoskeletal: Normal range of motion.  Neurological: Pt is alert and oriented to person, place, and time.  Skin: Skin is warm and dry.  Psychiatric: Pt has a normal mood and affect. Pt's behavior is normal.   Results for orders placed in visit on 09/09/12  CBC WITH DIFFERENTIAL      Result Value Range   WBC 4.5  4.0 - 10.5 K/uL   RBC 4.68  4.22 - 5.81 MIL/uL   Hemoglobin 15.0  13.0 - 17.0 g/dL   HCT 16.1  09.6 - 04.5 %   MCV 93.4  78.0 - 100.0 fL   MCH 32.1  26.0 - 34.0 pg   MCHC 34.3  30.0 - 36.0 g/dL   RDW 40.9  81.1 - 91.4 %   Platelets 259  150 - 400 K/uL   Neutrophils Relative % 45  43 - 77 %   Neutro Abs 2.1  1.7 - 7.7 K/uL   Lymphocytes Relative 39  12 - 46 %   Lymphs Abs 1.8  0.7 - 4.0 K/uL   Monocytes Relative 11  3 - 12 %   Monocytes Absolute 0.5  0.1 - 1.0 K/uL   Eosinophils Relative 3  0 - 5 %   Eosinophils Absolute 0.1  0.0 - 0.7 K/uL   Basophils Relative 2 (*) 0 - 1 %   Basophils Absolute 0.1  0.0 - 0.1 K/uL   Smear Review Criteria for review not met    COMPREHENSIVE METABOLIC PANEL      Result Value Range   Sodium 139  135 - 145 mEq/L   Potassium 3.8  3.5 - 5.3 mEq/L   Chloride 105  96 - 112 mEq/L   CO2 25  19 - 32 mEq/L   Glucose, Bld 118 (*) 70 - 99 mg/dL   BUN 25 (*) 6 - 23 mg/dL   Creat 7.82  9.56 - 2.13 mg/dL   Total Bilirubin 1.4 (*) 0.3 - 1.2 mg/dL   Alkaline Phosphatase 43  39 - 117 U/L   AST 18  0 - 37 U/L   ALT 28  0 - 53 U/L   Total Protein 7.2  6.0 - 8.3 g/dL   Albumin 4.3  3.5 - 5.2 g/dL   Calcium 9.5  8.4 - 08.6 mg/dL  LIPID PANEL      Result Value Range   Cholesterol 194  0 - 200 mg/dL   Triglycerides 578 (*) <150  mg/dL   HDL 36 (*) >16 mg/dL   Total CHOL/HDL Ratio 5.4     VLDL 41 (*) 0 - 40 mg/dL   LDL  Cholesterol 109 (*) 0 - 99 mg/dL  POCT GLYCOSYLATED HEMOGLOBIN (HGB A1C)      Result Value Range   Hemoglobin A1C 6.0    GLUCOSE, POCT (MANUAL RESULT ENTRY)      Result Value Range   POC Glucose 110 (*) 70 - 99 mg/dl  Strep neg BP 604/54  Pulse 73  Temp(Src) 97.6 F (36.4 C) (Oral)  Resp 18  Ht 5\' 10"  (1.778 m)  Wt 238 lb (107.956 kg)  BMI 34.15 kg/m2  SpO2 96%     Assessment & Plan:   We'll proceed with MRI because of this is his second episode of vertigo. He also has some pain around the left mastoid with hearing loss. We'll treat with Antivert 25 a half to one 3 times a day    I personally performed the services described in this documentation, which was scribed in my presence. The recorded information has been reviewed and is accurate.

## 2013-02-02 NOTE — Patient Instructions (Signed)
Vertigo Vertigo means you feel like you or your surroundings are moving when they are not. Vertigo can be dangerous if it occurs when you are at work, driving, or performing difficult activities.  CAUSES  Vertigo occurs when there is a conflict of signals sent to your brain from the visual and sensory systems in your body. There are many different causes of vertigo, including:  Infections, especially in the inner ear.  A bad reaction to a drug or misuse of alcohol and medicines.  Withdrawal from drugs or alcohol.  Rapidly changing positions, such as lying down or rolling over in bed.  A migraine headache.  Decreased blood flow to the brain.  Increased pressure in the brain from a head injury, infection, tumor, or bleeding. SYMPTOMS  You may feel as though the world is spinning around or you are falling to the ground. Because your balance is upset, vertigo can cause nausea and vomiting. You may have involuntary eye movements (nystagmus). DIAGNOSIS  Vertigo is usually diagnosed by physical exam. If the cause of your vertigo is unknown, your caregiver may perform imaging tests, such as an MRI scan (magnetic resonance imaging). TREATMENT  Most cases of vertigo resolve on their own, without treatment. Depending on the cause, your caregiver may prescribe certain medicines. If your vertigo is related to body position issues, your caregiver may recommend movements or procedures to correct the problem. In rare cases, if your vertigo is caused by certain inner ear problems, you may need surgery. HOME CARE INSTRUCTIONS   Follow your caregiver's instructions.  Avoid driving.  Avoid operating heavy machinery.  Avoid performing any tasks that would be dangerous to you or others during a vertigo episode.  Tell your caregiver if you notice that certain medicines seem to be causing your vertigo. Some of the medicines used to treat vertigo episodes can actually make them worse in some people. SEEK  IMMEDIATE MEDICAL CARE IF:   Your medicines do not relieve your vertigo or are making it worse.  You develop problems with talking, walking, weakness, or using your arms, hands, or legs.  You develop severe headaches.  Your nausea or vomiting continues or gets worse.  You develop visual changes.  A family member notices behavioral changes.  Your condition gets worse. MAKE SURE YOU:  Understand these instructions.  Will watch your condition.  Will get help right away if you are not doing well or get worse. Document Released: 12/20/2004 Document Revised: 06/04/2011 Document Reviewed: 09/28/2010 Exit[employer] Patient Information 2014 [employer].  

## 2013-02-04 LAB — CULTURE, GROUP A STREP: Organism ID, Bacteria: NORMAL

## 2013-02-06 ENCOUNTER — Ambulatory Visit
Admission: RE | Admit: 2013-02-06 | Discharge: 2013-02-06 | Disposition: A | Discharge status: Home / Self Care | Payer: Medicare Other | Source: Ambulatory Visit | Attending: Emergency Medicine | Admitting: Emergency Medicine

## 2013-02-06 DIAGNOSIS — R42 Dizziness and giddiness: Secondary | ICD-10-CM

## 2013-02-06 DIAGNOSIS — D1779 Benign lipomatous neoplasm of other sites: Secondary | ICD-10-CM | POA: Diagnosis not present

## 2013-02-09 ENCOUNTER — Other Ambulatory Visit: Payer: Self-pay | Admitting: Radiology

## 2013-02-09 DIAGNOSIS — J329 Chronic sinusitis, unspecified: Secondary | ICD-10-CM

## 2013-02-09 MED ORDER — AMOXICILLIN 875 MG PO TABS: 875.0000 mg | ORAL_TABLET | Freq: Two times a day (BID) | ORAL | Status: DC

## 2013-03-17 ENCOUNTER — Encounter: Payer: Self-pay | Admitting: Emergency Medicine

## 2013-03-17 ENCOUNTER — Ambulatory Visit (INDEPENDENT_AMBULATORY_CARE_PROVIDER_SITE_OTHER): Payer: Medicare Other | Admitting: Emergency Medicine

## 2013-03-17 VITALS — BP 118/64 | HR 66 | Temp 98.0°F | Resp 18 | Ht 70.5 in | Wt 242.0 lb

## 2013-03-17 DIAGNOSIS — F411 Generalized anxiety disorder: Secondary | ICD-10-CM

## 2013-03-17 DIAGNOSIS — I10 Essential (primary) hypertension: Secondary | ICD-10-CM

## 2013-03-17 DIAGNOSIS — Z125 Encounter for screening for malignant neoplasm of prostate: Secondary | ICD-10-CM | POA: Diagnosis not present

## 2013-03-17 DIAGNOSIS — Z85038 Personal history of other malignant neoplasm of large intestine: Secondary | ICD-10-CM | POA: Diagnosis not present

## 2013-03-17 DIAGNOSIS — R972 Elevated prostate specific antigen [PSA]: Secondary | ICD-10-CM

## 2013-03-17 DIAGNOSIS — I25810 Atherosclerosis of coronary artery bypass graft(s) without angina pectoris: Secondary | ICD-10-CM

## 2013-03-17 DIAGNOSIS — Z139 Encounter for screening, unspecified: Secondary | ICD-10-CM | POA: Diagnosis not present

## 2013-03-17 DIAGNOSIS — E785 Hyperlipidemia, unspecified: Secondary | ICD-10-CM | POA: Diagnosis not present

## 2013-03-17 LAB — CBC WITH DIFFERENTIAL/PLATELET
Basophils Absolute: 0.1 10*3/uL (ref 0.0–0.1)
Basophils Relative: 1 % (ref 0–1)
Eosinophils Relative: 4 % (ref 0–5)
Hemoglobin: 15.2 g/dL (ref 13.0–17.0)
Lymphs Abs: 2 10*3/uL (ref 0.7–4.0)
MCHC: 35.7 g/dL (ref 30.0–36.0)
Monocytes Relative: 12 % (ref 3–12)
Neutro Abs: 2 10*3/uL (ref 1.7–7.7)
Neutrophils Relative %: 41 % — ABNORMAL LOW (ref 43–77)
Platelets: 244 10*3/uL (ref 150–400)
RBC: 4.76 MIL/uL (ref 4.22–5.81)

## 2013-03-17 LAB — COMPLETE METABOLIC PANEL WITH GFR
AST: 21 U/L (ref 0–37)
Albumin: 4.3 g/dL (ref 3.5–5.2)
BUN: 16 mg/dL (ref 6–23)
CO2: 25 mEq/L (ref 19–32)
Calcium: 9.5 mg/dL (ref 8.4–10.5)
Chloride: 101 mEq/L (ref 96–112)
Creat: 1.04 mg/dL (ref 0.50–1.35)
GFR, Est African American: 85 mL/min
GFR, Est Non African American: 73 mL/min
Glucose, Bld: 115 mg/dL — ABNORMAL HIGH (ref 70–99)
Potassium: 3.5 mEq/L (ref 3.5–5.3)
Total Protein: 7.1 g/dL (ref 6.0–8.3)

## 2013-03-17 LAB — IFOBT (OCCULT BLOOD): IFOBT: POSITIVE

## 2013-03-17 LAB — LIPID PANEL: Total CHOL/HDL Ratio: 4.6 Ratio

## 2013-03-17 MED ORDER — ALPRAZOLAM 0.5 MG PO TABS: ORAL_TABLET | ORAL | Status: DC

## 2013-03-17 MED ORDER — ZOLPIDEM TARTRATE 10 MG PO TABS: ORAL_TABLET | ORAL | Status: DC

## 2013-03-17 NOTE — Progress Notes (Signed)
   Subjective:    Patient ID: Ryan Hernandez, male    DOB: 27-Mar-1945, 67 y.o.   MRN: [id number]  HPI patient enters for followup high blood pressure high cholesterol coronary disease and colon cancer. He was seen the urologist regular but would prefer we do his PSAs here. He has no other specific complaints today. He is concerned about his daughter who recently has been cutting herself in voice some suicidal ideations   Review of Systems     Objective:   Physical Exam Patient is alert and cooperative he is in no distress. His neck is supple. Chest clear heart regular rate no murmurs abdomen protuberant without masses extremities without edema prostate check revealed a slightly asymmetric prostate without definite mass.       Assessment & Plan:   I gave him written prescriptions for his Ambien and Xanax today. It will be okay to refill his other medications as needed. Will recheck in 6 months .

## 2013-03-22 ENCOUNTER — Other Ambulatory Visit: Payer: Self-pay | Admitting: Emergency Medicine

## 2013-03-23 NOTE — Addendum Note (Signed)
Addended by: Johnnette Litter on: 03/23/2013 02:13 PM   Modules accepted: Orders

## 2013-03-24 ENCOUNTER — Telehealth: Payer: Self-pay | Admitting: Radiology

## 2013-03-24 NOTE — Telephone Encounter (Signed)
Labs sent to [facility] they did not get them with the referral

## 2013-03-31 DIAGNOSIS — N138 Other obstructive and reflux uropathy: Secondary | ICD-10-CM | POA: Diagnosis not present

## 2013-03-31 DIAGNOSIS — N139 Obstructive and reflux uropathy, unspecified: Secondary | ICD-10-CM | POA: Diagnosis not present

## 2013-03-31 DIAGNOSIS — N401 Enlarged prostate with lower urinary tract symptoms: Secondary | ICD-10-CM | POA: Diagnosis not present

## 2013-03-31 DIAGNOSIS — R972 Elevated prostate specific antigen [PSA]: Secondary | ICD-10-CM | POA: Diagnosis not present

## 2013-04-14 ENCOUNTER — Other Ambulatory Visit: Payer: Self-pay | Admitting: Emergency Medicine

## 2013-04-14 NOTE — Telephone Encounter (Signed)
Pt called and apologized. He does have the Rx written by Dr Everlene Farrier and will take to pharmacy.

## 2013-04-14 NOTE — Telephone Encounter (Signed)
Called [facility] to see if they have the 03/17/13 Rx and RFs on file. They do not have a record of this Rx. LMOM for pt to CB to clarify what pt did with the Rx that was printed at 12/23 OV.

## 2013-05-13 ENCOUNTER — Telehealth: Payer: Self-pay

## 2013-05-13 NOTE — Telephone Encounter (Signed)
PATIENT WAS CALLING TO SEE IF WE RECEIVED FAX FROM [facility] REGARDING CPAP MACHINE

## 2013-05-14 NOTE — Telephone Encounter (Signed)
Spoke to pt, explained to my knowledge we have not received it yet, i explained  If he would like to contact [facility] again and have them to refax the form we would definitely be looking for it and from there follow  protocol for completion.

## 2013-05-18 ENCOUNTER — Other Ambulatory Visit: Payer: Self-pay | Admitting: Emergency Medicine

## 2013-06-19 ENCOUNTER — Other Ambulatory Visit: Payer: Self-pay | Admitting: Emergency Medicine

## 2013-06-23 ENCOUNTER — Telehealth: Payer: Self-pay | Admitting: Radiology

## 2013-06-23 NOTE — Telephone Encounter (Signed)
Faxed orders to [facility] for CPAP , Sent originals to be scanned.

## 2013-08-29 ENCOUNTER — Encounter: Payer: Self-pay | Admitting: Emergency Medicine

## 2013-09-03 ENCOUNTER — Encounter: Payer: Self-pay | Admitting: *Deleted

## 2013-09-08 NOTE — Telephone Encounter (Signed)
The results have been released (automatically after 4 days), but as of now, the patient hasn't viewed them.

## 2013-09-15 ENCOUNTER — Encounter: Payer: Self-pay | Admitting: Emergency Medicine

## 2013-09-15 ENCOUNTER — Ambulatory Visit (INDEPENDENT_AMBULATORY_CARE_PROVIDER_SITE_OTHER): Payer: Medicare Other | Admitting: Emergency Medicine

## 2013-09-15 VITALS — BP 110/70 | HR 66 | Temp 97.5°F | Resp 16 | Ht 69.0 in | Wt 243.0 lb

## 2013-09-15 DIAGNOSIS — Z23 Encounter for immunization: Secondary | ICD-10-CM

## 2013-09-15 DIAGNOSIS — E785 Hyperlipidemia, unspecified: Secondary | ICD-10-CM | POA: Diagnosis not present

## 2013-09-15 DIAGNOSIS — I10 Essential (primary) hypertension: Secondary | ICD-10-CM | POA: Diagnosis not present

## 2013-09-15 DIAGNOSIS — Z79899 Other long term (current) drug therapy: Secondary | ICD-10-CM | POA: Diagnosis not present

## 2013-09-15 DIAGNOSIS — R079 Chest pain, unspecified: Secondary | ICD-10-CM

## 2013-09-15 DIAGNOSIS — F411 Generalized anxiety disorder: Secondary | ICD-10-CM | POA: Diagnosis not present

## 2013-09-15 LAB — CBC WITH DIFFERENTIAL/PLATELET
Basophils Absolute: 0 10*3/uL (ref 0.0–0.1)
Basophils Relative: 1 % (ref 0–1)
EOS ABS: 0.2 10*3/uL (ref 0.0–0.7)
Eosinophils Relative: 4 % (ref 0–5)
HCT: 40.7 % (ref 39.0–52.0)
Hemoglobin: 14.1 g/dL (ref 13.0–17.0)
LYMPHS ABS: 1.8 10*3/uL (ref 0.7–4.0)
LYMPHS PCT: 43 % (ref 12–46)
MCH: 30.9 pg (ref 26.0–34.0)
MCHC: 34.6 g/dL (ref 30.0–36.0)
MCV: 89.1 fL (ref 78.0–100.0)
Monocytes Absolute: 0.5 10*3/uL (ref 0.1–1.0)
Monocytes Relative: 12 % (ref 3–12)
NEUTROS PCT: 40 % — AB (ref 43–77)
Neutro Abs: 1.7 10*3/uL (ref 1.7–7.7)
Platelets: 286 10*3/uL (ref 150–400)
RBC: 4.57 MIL/uL (ref 4.22–5.81)
RDW: 14.7 % (ref 11.5–15.5)
WBC: 4.3 10*3/uL (ref 4.0–10.5)

## 2013-09-15 LAB — COMPLETE METABOLIC PANEL WITH GFR
ALT: 21 U/L (ref 0–53)
AST: 16 U/L (ref 0–37)
Albumin: 4.2 g/dL (ref 3.5–5.2)
Alkaline Phosphatase: 46 U/L (ref 39–117)
BILIRUBIN TOTAL: 1.1 mg/dL (ref 0.2–1.2)
BUN: 21 mg/dL (ref 6–23)
CHLORIDE: 103 meq/L (ref 96–112)
CO2: 25 meq/L (ref 19–32)
Calcium: 9.4 mg/dL (ref 8.4–10.5)
Creat: 1.08 mg/dL (ref 0.50–1.35)
GFR, Est African American: 81 mL/min
GFR, Est Non African American: 70 mL/min
Glucose, Bld: 116 mg/dL — ABNORMAL HIGH (ref 70–99)
POTASSIUM: 3.6 meq/L (ref 3.5–5.3)
SODIUM: 137 meq/L (ref 135–145)
TOTAL PROTEIN: 7.4 g/dL (ref 6.0–8.3)

## 2013-09-15 LAB — LIPID PANEL
Cholesterol: 226 mg/dL — ABNORMAL HIGH (ref 0–200)
HDL: 30 mg/dL — AB (ref >39)
Total CHOL/HDL Ratio: 7.5 Ratio
Triglycerides: 452 mg/dL — ABNORMAL HIGH (ref <150)

## 2013-09-15 MED ORDER — ZOLPIDEM TARTRATE 10 MG PO TABS: ORAL_TABLET | ORAL | Status: DC

## 2013-09-15 NOTE — Patient Instructions (Signed)
Nosebleed  Nosebleeds can be caused by many conditions including trauma, infections, polyps, foreign bodies, dry mucous membranes or climate, medications and air conditioning. Most nosebleeds occur in the front of the nose. It is because of this location that most nosebleeds can be controlled by pinching the nostrils gently and continuously. Do this for at least 10 to 20 minutes. The reason for this long continuous pressure is that you must hold it long enough for the blood to clot. If during that 10 to 20 minute time period, pressure is released, the process may have to be started again. The nosebleed may stop by itself, quit with pressure, need concentrated heating (cautery) or stop with pressure from packing.  HOME CARE INSTRUCTIONS    If your nose was packed, try to maintain the pack inside until your caregiver removes it. If a gauze pack was used and it starts to fall out, gently replace or cut the end off. Do not cut if a balloon catheter was used to pack the nose. Otherwise, do not remove unless instructed.   Avoid blowing your nose for 12 hours after treatment. This could dislodge the pack or clot and start bleeding again.   If the bleeding starts again, sit up and bending forward, gently pinch the front half of your nose continuously for 20 minutes.   If bleeding was caused by dry mucous membranes, cover the inside of your nose every morning with a petroleum or antibiotic ointment. Use your little fingertip as an applicator. Do this as needed during dry weather. This will keep the mucous membranes moist and allow them to heal.   Maintain humidity in your home by using less air conditioning or using a humidifier.   Do not use aspirin or medications which make bleeding more likely. Your caregiver can give you recommendations on this.   Resume normal activities as able but try to avoid straining, lifting or bending at the waist for several days.   If the nosebleeds become recurrent and the cause is  unknown, your caregiver may suggest laboratory tests.  SEEK IMMEDIATE MEDICAL CARE IF:    Bleeding recurs and cannot be controlled.   There is unusual bleeding from or bruising on other parts of the body.   You have a fever.   Nosebleeds continue.   There is any worsening of the condition which originally brought you in.   You become lightheaded, feel faint, become sweaty or vomit blood.  MAKE SURE YOU:    Understand these instructions.   Will watch your condition.   Will get help right away if you are not doing well or get worse.  Document Released: 12/20/2004 Document Revised: 06/04/2011 Document Reviewed: 02/11/2009  ExitCare Patient Information 2015 [employer]. This information is not intended to replace advice given to you by your health care provider. Make sure you discuss any questions you have with your health care provider.

## 2013-09-15 NOTE — Progress Notes (Signed)
   Subjective:    Patient ID: Ryan Hernandez, male    DOB: 07-31-1945, 68 y.o.   MRN: [id number]  HPI patient in for followup of hypertension hyperlipidemia. He has a history of colon cancer and is up-to-date on his colonoscopies he had a nosebleed yesterday. He is on a CPAP machine. Overall he is feeling well. He is proceeding with evaluation by the [facility] to see if he qualifies for disability regarding Agent Orange exposure. This is in process to    Review of Systems  Constitutional: Negative.   HENT: Positive for nosebleeds.   Eyes: Negative.   Respiratory: Negative.   Cardiovascular: Positive for chest pain.       He had one episode of chest pain for which he took nitroglycerin 2  Endocrine: Negative.   Genitourinary: Negative.   Skin:       He has multiple nodules in the skin and is proceeding with evaluation through the [facility] system       Objective:   Physical Exam patient looks good today. His neck is supple chest clear heart regular rate no murmurs the abdomen is soft liver spleen not enlarged no masses are felt extremities are without edema. There is crusting present over both nares.        Assessment & Plan:  I advised him to use some Vaseline regarding his nosebleeds. His routine labs were done today. I did refill his Ambien. I also gave him Prevnar immunization.

## 2013-09-22 DIAGNOSIS — R972 Elevated prostate specific antigen [PSA]: Secondary | ICD-10-CM | POA: Diagnosis not present

## 2013-09-26 ENCOUNTER — Other Ambulatory Visit: Payer: Self-pay | Admitting: Emergency Medicine

## 2013-09-28 NOTE — Telephone Encounter (Signed)
Faxed

## 2013-10-06 DIAGNOSIS — R972 Elevated prostate specific antigen [PSA]: Secondary | ICD-10-CM | POA: Diagnosis not present

## 2013-10-06 DIAGNOSIS — N401 Enlarged prostate with lower urinary tract symptoms: Secondary | ICD-10-CM | POA: Diagnosis not present

## 2013-10-06 DIAGNOSIS — N138 Other obstructive and reflux uropathy: Secondary | ICD-10-CM | POA: Diagnosis not present

## 2013-10-06 DIAGNOSIS — N139 Obstructive and reflux uropathy, unspecified: Secondary | ICD-10-CM | POA: Diagnosis not present

## 2013-12-24 DIAGNOSIS — Z23 Encounter for immunization: Secondary | ICD-10-CM | POA: Diagnosis not present

## 2014-01-01 ENCOUNTER — Encounter: Payer: Self-pay | Admitting: *Deleted

## 2014-01-11 ENCOUNTER — Encounter: Payer: Self-pay | Admitting: Cardiology

## 2014-01-11 DIAGNOSIS — I2510 Atherosclerotic heart disease of native coronary artery without angina pectoris: Secondary | ICD-10-CM | POA: Diagnosis not present

## 2014-01-11 NOTE — Progress Notes (Signed)
Patient ID: Ryan Hernandez, male   DOB: May 16, 1945, 68 y.o.   MRN: [id number]   Ryan Hernandez, Ryan Hernandez    Date of visit:  01/11/2014 DOB:  March 12, 1946    Age:  68 yrs. Medical record number:  [id number]     Account number:  [id number] Primary Care Provider: Arlyss Queen A ____________________________ CURRENT DIAGNOSES  1. Atherosclerotic heart disease of native coronary artery with angina pectoris  2. Essential (primary) hypertension  3. Hyperlipidemia, unspecified  4. Other obesity  5. Coronary angioplasty status ____________________________ ALLERGIES  Ace Inhibitors, Intolerance-cough ____________________________ MEDICATIONS  1. nitroglycerin 0.4 mg tablet, sublingual, PRN  2. Miralax 17 gram Powder in Packet, 1 p.o. daily  3. alprazolam 0.5 mg tablet, PRN  4. tamsulosin 0.4 mg capsule,extended release 24hr, 1 p.o. daily  5. meloxicam 7.5 mg tablet, PRN  6. amlodipine 10 mg tablet, 1 p.o. daily  7. Advil PM 200 mg-38 mg tablet, 2 qhs  8. zolpidem 10 mg tablet, hs prn  9. atorvastatin 40 mg tablet, 1 p.o. daily  10. aspirin 81 mg chewable tablet, 1 p.o. daily  11. losartan 100 mg-hydrochlorothiazide 25 mg tablet, 1 p.o. daily  12. finasteride 5 mg tablet, 1 p.o. daily ____________________________ CHIEF COMPLAINTS  Chest pain with exertion  Followup of Atherosclerotic heart disease of native coronary artery without angina pectoris ____________________________ HISTORY OF PRESENT ILLNESS Patient seen for cardiac followup. 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 About 3 months ago he began to have some mild angina. He reports it is worse at the end of the day he is getting in and out of his car and typically is relieved with nitroglycerin after about 2 minutes. He will take a nitroglycerin about once a week. He has no rest angina. He remains obese and denies PND, orthopnea, syncope,  palpitations, or claudication. ____________________________ PAST HISTORY  Past Medical Illnesses:  hypertension, hyperlipidemia, colon cancer treated with surgery, prostatitis, diverticulosis, obesity, history of UTI, BPH;  Cardiovascular Illnesses:  CAD;  Surgical Procedures:  appendectomy, colectomy-partial, tonsillectomy;  Cardiology Procedures-Invasive:  cardiac cath (left) November 2007, PTCA of the Diag 1  Dr. 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, 95% stenosis OM 2, 40% stenosis distal RCA;  LVEF of 60% documented via cardiac cath on 02/11/2006,   ____________________________ CARDIO-PULMONARY TEST DATES EKG Date:  01/11/2014;   Cardiac Cath Date:  02/11/2006;  Stent Placement Date: 02/11/2006;   ____________________________ FAMILY HISTORY Brother -- Brother alive and well Brother -- Brother alive and well Father -- Father dead, Congestive heart failure Mother -- Mother dead, Hypertension ____________________________ SOCIAL HISTORY Alcohol Use:  no alcohol use;  Smoking:  used to smoke but quit 1984;  Diet:  regular diet;  Lifestyle:  married;  Exercise:  walking;  Occupation:  Security guard route and dispatcher [employer];  Illicit Drug Use:  denies substance abuse;  Residence:  lives with wife and adopted grand daughter;   ____________________________ REVIEW OF SYSTEMS General:  obesity, weight gain of approximately 5 lbs Eyes: wears eye glasses/contact lenses Respiratory: denies dyspnea, cough, wheezing or hemoptysis. Cardiovascular:  please review HPI Abdominal: intermittent constipation Genitourinary-Male: hesitancy  Musculoskeletal:  mild chronic low back pain, generalized arthritis Psychiatric:  denies depession or anxiety.  ____________________________ PHYSICAL EXAMINATION VITAL SIGNS  Blood Pressure:  110/60 Sitting, Left arm, regular cuff  ,  118/64 Standing, Left arm and regular cuff   Pulse:  76/min. Weight:  244.00 lbs. Height:  70"BMI: 35  Constitutional:  pleasant white male in no acute distress, moderately obese Skin:  warm and dry to touch, no apparent skin lesions, or masses noted. Head:  normocephalic, balding male hair pattern ENT:  ears, nose and throat reveal no gross abnormalities.  Dentition good. Neck:  supple, no masses, thyromegaly, JVD. Carotid pulses are full and equal bilaterally without bruits. Chest:  normal symmetry, clear to auscultation. Cardiac:  regular rhythm, normal S1 and S2, No S3 or S4, no murmurs, gallops or rubs detected. Peripheral Pulses:  the femoral,dorsalis pedis, and posterior tibial pulses are full and equal bilaterally with no bruits auscultated. Extremities & Back:  no deformities, clubbing, cyanosis, erythema or edema observed. Normal muscle strength and tone. Neurological:  no gross motor or sensory deficits noted, affect appropriate, oriented x3. ____________________________ MOST RECENT LIPID PANEL 01/13/13  CHOL TOTL 158 mg/dl, LDL 84 calc, HDL 32 mg/dl, TRIGLYCER 208 mg/dl and CHOL/HDL 4.9 (Calc) ____________________________ IMPRESSIONS/PLAN  1. Recurrent angina in a patient with previous coronary artery disease 2. Coronary artery disease with previous PCI with recurrent angina 3. Obesity does need to lose weight 4. Hypertension currently controlled  Recommendations:  Does have some recurrent angina. He is now also spending some time at the [place]. He reports a recent echocardiogram. I would like him to have a Lexiscan myocardial perfusion scan to determine if he has worsening ischemia.  ____________________________ TODAYS ORDERS  1. Lexiscan 1 day: First Available  2. 12 Lead EKG: Today                       ____________________________ Cardiology Physician:  Kerry Hough MD [facility]

## 2014-01-12 ENCOUNTER — Telehealth: Payer: Self-pay

## 2014-01-12 NOTE — Telephone Encounter (Signed)
Pt's medman chart is [id number]  bf

## 2014-01-12 NOTE — Telephone Encounter (Signed)
Pt brought letter from [facility] requiring records pertaining to his diabetes mellitus and coronary artery disease. Pt sees dr Everlene Farrier. He also asked for records referencing his cancer diagnosis. Pt signed release and gave me permission to fill out his demographic info and requested items on the release.   Please call pt with questions: [phone]  Record release and letter from [place] is in interoffice envelope marked to medical records.  bf

## 2014-01-13 NOTE — Telephone Encounter (Signed)
Medical records has received request and will process accordingly.

## 2014-01-14 NOTE — Telephone Encounter (Signed)
Records ready for pick up. Left message for patient to pick up.

## 2014-01-21 DIAGNOSIS — I10 Essential (primary) hypertension: Secondary | ICD-10-CM | POA: Diagnosis not present

## 2014-01-21 DIAGNOSIS — E785 Hyperlipidemia, unspecified: Secondary | ICD-10-CM | POA: Diagnosis not present

## 2014-01-21 DIAGNOSIS — I2510 Atherosclerotic heart disease of native coronary artery without angina pectoris: Secondary | ICD-10-CM | POA: Diagnosis not present

## 2014-01-21 DIAGNOSIS — E668 Other obesity: Secondary | ICD-10-CM | POA: Diagnosis not present

## 2014-03-01 ENCOUNTER — Other Ambulatory Visit: Payer: Self-pay | Admitting: Emergency Medicine

## 2014-03-02 ENCOUNTER — Other Ambulatory Visit: Payer: Self-pay | Admitting: Radiology

## 2014-03-02 NOTE — Telephone Encounter (Signed)
Faxed alprazolam to pharmacy

## 2014-03-04 ENCOUNTER — Other Ambulatory Visit: Payer: Self-pay | Admitting: Emergency Medicine

## 2014-03-09 ENCOUNTER — Encounter: Payer: Self-pay | Admitting: Emergency Medicine

## 2014-03-09 ENCOUNTER — Ambulatory Visit (INDEPENDENT_AMBULATORY_CARE_PROVIDER_SITE_OTHER): Payer: Medicare Other | Admitting: Emergency Medicine

## 2014-03-09 VITALS — BP 144/76 | HR 82 | Temp 97.6°F | Resp 16 | Ht 69.5 in | Wt 240.0 lb

## 2014-03-09 DIAGNOSIS — R739 Hyperglycemia, unspecified: Secondary | ICD-10-CM

## 2014-03-09 DIAGNOSIS — Z125 Encounter for screening for malignant neoplasm of prostate: Secondary | ICD-10-CM | POA: Diagnosis not present

## 2014-03-09 DIAGNOSIS — R972 Elevated prostate specific antigen [PSA]: Secondary | ICD-10-CM | POA: Diagnosis not present

## 2014-03-09 DIAGNOSIS — E785 Hyperlipidemia, unspecified: Secondary | ICD-10-CM | POA: Diagnosis not present

## 2014-03-09 DIAGNOSIS — I10 Essential (primary) hypertension: Secondary | ICD-10-CM | POA: Diagnosis not present

## 2014-03-09 DIAGNOSIS — Z0000 Encounter for general adult medical examination without abnormal findings: Secondary | ICD-10-CM

## 2014-03-09 DIAGNOSIS — Z0100 Encounter for examination of eyes and vision without abnormal findings: Secondary | ICD-10-CM | POA: Diagnosis not present

## 2014-03-09 LAB — CBC WITH DIFFERENTIAL/PLATELET
BASOS ABS: 0 10*3/uL (ref 0.0–0.1)
BASOS PCT: 1 % (ref 0–1)
EOS ABS: 0.2 10*3/uL (ref 0.0–0.7)
Eosinophils Relative: 4 % (ref 0–5)
HEMATOCRIT: 44.2 % (ref 39.0–52.0)
Hemoglobin: 14.9 g/dL (ref 13.0–17.0)
Lymphocytes Relative: 39 % (ref 12–46)
Lymphs Abs: 1.9 10*3/uL (ref 0.7–4.0)
MCH: 31.5 pg (ref 26.0–34.0)
MCHC: 33.7 g/dL (ref 30.0–36.0)
MCV: 93.4 fL (ref 78.0–100.0)
MONO ABS: 0.6 10*3/uL (ref 0.1–1.0)
MONOS PCT: 12 % (ref 3–12)
MPV: 9.7 fL (ref 9.4–12.4)
Neutro Abs: 2.1 10*3/uL (ref 1.7–7.7)
Neutrophils Relative %: 44 % (ref 43–77)
Platelets: 243 10*3/uL (ref 150–400)
RBC: 4.73 MIL/uL (ref 4.22–5.81)
RDW: 14.2 % (ref 11.5–15.5)
WBC: 4.8 10*3/uL (ref 4.0–10.5)

## 2014-03-09 LAB — POCT URINALYSIS DIPSTICK
Bilirubin, UA: NEGATIVE
Glucose, UA: NEGATIVE
KETONES UA: NEGATIVE
Leukocytes, UA: NEGATIVE
Nitrite, UA: NEGATIVE
PROTEIN UA: NEGATIVE
RBC UA: NEGATIVE
SPEC GRAV UA: 1.015
UROBILINOGEN UA: 0.2
pH, UA: 6

## 2014-03-09 LAB — COMPLETE METABOLIC PANEL WITH GFR
ALBUMIN: 4.1 g/dL (ref 3.5–5.2)
ALT: 23 U/L (ref 0–53)
AST: 18 U/L (ref 0–37)
Alkaline Phosphatase: 43 U/L (ref 39–117)
BILIRUBIN TOTAL: 1.4 mg/dL — AB (ref 0.2–1.2)
BUN: 17 mg/dL (ref 6–23)
CO2: 29 mEq/L (ref 19–32)
CREATININE: 1.19 mg/dL (ref 0.50–1.35)
Calcium: 9.6 mg/dL (ref 8.4–10.5)
Chloride: 103 mEq/L (ref 96–112)
GFR, EST NON AFRICAN AMERICAN: 62 mL/min
GFR, Est African American: 72 mL/min
GLUCOSE: 120 mg/dL — AB (ref 70–99)
Potassium: 3.8 mEq/L (ref 3.5–5.3)
Sodium: 140 mEq/L (ref 135–145)
Total Protein: 7 g/dL (ref 6.0–8.3)

## 2014-03-09 LAB — LIPID PANEL
CHOL/HDL RATIO: 5.7 ratio
Cholesterol: 211 mg/dL — ABNORMAL HIGH (ref 0–200)
HDL: 37 mg/dL — AB (ref >39)
LDL Cholesterol: 140 mg/dL — ABNORMAL HIGH (ref 0–99)
TRIGLYCERIDES: 168 mg/dL — AB (ref <150)
VLDL: 34 mg/dL (ref 0–40)

## 2014-03-09 MED ORDER — MELOXICAM 7.5 MG PO TABS: 7.5000 mg | ORAL_TABLET | Freq: Every day | ORAL | Status: DC

## 2014-03-09 MED ORDER — ZOLPIDEM TARTRATE 10 MG PO TABS: 10.0000 mg | ORAL_TABLET | Freq: Every day | ORAL | Status: DC

## 2014-03-09 MED ORDER — AMLODIPINE BESYLATE 10 MG PO TABS: 10.0000 mg | ORAL_TABLET | Freq: Every day | ORAL | Status: DC

## 2014-03-09 MED ORDER — ALPRAZOLAM 0.5 MG PO TABS: 0.5000 mg | ORAL_TABLET | Freq: Two times a day (BID) | ORAL | Status: DC | PRN

## 2014-03-09 MED ORDER — LOSARTAN POTASSIUM-HCTZ 100-25 MG PO TABS: 1.0000 | ORAL_TABLET | Freq: Every day | ORAL | Status: DC

## 2014-03-09 MED ORDER — ATORVASTATIN CALCIUM 40 MG PO TABS: ORAL_TABLET | ORAL | Status: DC

## 2014-03-09 NOTE — Progress Notes (Signed)
Subjective:  This chart was scribed for Nena Jordan, MD by Dellis Filbert, ED Scribe at [facility].The patient was seen in exam room 21 and the patient's care was started at 1:02 PM.   Patient ID: Ryan Hernandez, male    DOB: Aug 21, 1945, 68 y.o.   MRN: [id number] Chief Complaint  Patient presents with  . Annual Exam    TRADITIONAL MEDICARE, MEDICATION REFILL -    HPI HPI Comments: Ryan Hernandez is a 68 y.o. male who presents to [facility] here for an annual medicare physical exam and medication refill. Pt is seeing a cardiologist and urologist regularly. He has had an EKG and stress test and all came back normal. Pt had a hearing test and will get hearing aids on Dec. 24th. He has not seen an eye doctor. His last colonoscopy was last year and no polyps have been found. He asked to have his ears checked for excess ear wax, a new handicap tag, if he should have knee replacement and affordable dentures. Pt has constipation and abdominal pain. He takes medication for constipation regularly and states he has no issues unless he misses a dose. His PSA levels were checked and he was told it was 4.3. Pt has CPAP.   Immunization History  Administered Date(s) Administered  . Influenza Split 12/24/2013  . Influenza, Seasonal, Injecte, Preservative Fre 03/04/2012  . Pneumococcal Conjugate-13 09/15/2013  . Pneumococcal-Unspecified 04/14/2009  . Tdap 11/13/2011  . Zoster 03/15/2012   Patient Active Problem List   Diagnosis Date Noted  . Obesity (BMI 30-39.9)   . Hyperlipidemia 03/04/2012  . Hypertension 03/04/2012  . CAD (coronary artery disease) 03/04/2012  . OSA on CPAP 03/04/2012  . Anxiety 03/04/2012  . History of colon cancer    Past Medical History  Diagnosis Date  . Allergy   . Anxiety   . Arthritis   . Blood transfusion   . Cancer     colon  . Heart aneurysm   . Depression   . GERD (gastroesophageal reflux disease)     hx of, no medications currently  .  Hyperlipidemia   . Hypertension    Past Surgical History  Procedure Laterality Date  . Appendectomy    . Partial colectomy      colectomy  . Colonoscopy    . Fatty tumors      right arm  . Excisional hemorrhoidectomy    . Tonsillectomy     Allergies  Allergen Reactions  . Ace Inhibitors Cough   Prior to Admission medications   Medication Sig Start Date End Date Taking? Authorizing Provider  ALPRAZolam Duanne Moron) 0.5 MG tablet TAKE ONE TABLET BY MOUTH TWICE DAILY AS NEEDED 03/02/14  Yes Darlyne Russian, MD  aspirin 81 MG tablet Take 81 mg by mouth at bedtime.   Yes Historical Provider, MD  atorvastatin (LIPITOR) 40 MG tablet Take 1 tablet (40 mg total) by mouth daily. PATIENT NEEDS OFFICE VISIT FOR ADDITIONAL REFILLS 03/04/14  Yes Darlyne Russian, MD  losartan-hydrochlorothiazide (HYZAAR) 100-25 MG per tablet Take 1 tablet by mouth daily. 11/15/11  Yes Heather M Marte, PA-C  meloxicam (MOBIC) 7.5 MG tablet TAKE ONE TO TWO TABLETS BY MOUTH ONCE DAILY 05/18/13  Yes Darlyne Russian, MD  nitroGLYCERIN (NITROSTAT) 0.4 MG SL tablet Place 1 tablet (0.4 mg total) under the tongue every 5 (five) minutes as needed. 03/20/12  Yes Darlyne Russian, MD  polyethylene glycol (MIRALAX / GLYCOLAX) packet Take 17 g by  mouth daily.     Yes Historical Provider, MD  simvastatin (ZOCOR) 20 MG tablet 1 tablet Daily. 12/26/10  Yes Historical Provider, MD  tamsulosin (FLOMAX) 0.4 MG CAPS capsule TAKE ONE CAPSULE BY MOUTH EVERY DAY AFTER  SUPPER 04/14/13  Yes Darlyne Russian, MD  zolpidem (AMBIEN) 10 MG tablet TAKE ONE TABLET BY MOUTH ONCE DAILY AT BEDTIME AS NEEDED FOR SLEEP   Yes Darlyne Russian, MD  amLODipine (NORVASC) 10 MG tablet TAKE ONE TABLET BY MOUTH ONCE DAILY    Darlyne Russian, MD  meclizine (ANTIVERT) 25 MG tablet TAKE ONE-HALF TO ONE TABLET BY MOUTH THREE TIMES DAILY AS NEEDED FOR DIZZINESS Patient not taking: Reported on 03/09/2014 04/14/13   Darlyne Russian, MD   History   Social History  . Marital Status: Married      Spouse Name: N/A    Number of Children: N/A  . Years of Education: N/A   Occupational History  . Not on file.   Social History Main Topics  . Smoking status: Former Research scientist (life sciences)  . Smokeless tobacco: Not on file  . Alcohol Use: No  . Drug Use: No  . Sexual Activity: Not on file   Other Topics Concern  . Not on file   Social History Narrative   Review of Systems  Gastrointestinal: Positive for abdominal pain and constipation.      Objective:  BP 144/76 mmHg  Pulse 82  Temp(Src) 97.6 F (36.4 C) (Oral)  Resp 16  Ht 5' 9.5" (1.765 m)  Wt 240 lb (108.863 kg)  BMI 34.95 kg/m2  SpO2 96%  Physical Exam  Constitutional: He is oriented to person, place, and time. He appears well-developed and well-nourished. No distress.  Obesity  HENT:  Head: Normocephalic and atraumatic.  Right Ear: External ear normal.  Left Ear: External ear normal.  Nose: Nose normal.  Mouth/Throat: Oropharynx is clear and moist.  Eyes: Conjunctivae and EOM are normal. Pupils are equal, round, and reactive to light.  Neck: Normal range of motion. Neck supple.  Cardiovascular: Normal rate, regular rhythm and normal heart sounds.   No murmur heard. Pulmonary/Chest: Effort normal and breath sounds normal.  Abdominal: Soft. Bowel sounds are normal.  Large midline scar.  Neurological: He is alert and oriented to person, place, and time.  Skin: Skin is warm and dry.  Psychiatric: He has a normal mood and affect. His behavior is normal.  Nursing note and vitals reviewed.  Results for orders placed or performed in visit on 03/09/14  POCT urinalysis dipstick  Result Value Ref Range   Color, UA Yellow    Clarity, UA Clear    Glucose, UA Negative    Bilirubin, UA Negative    Ketones, UA Negative    Spec Grav, UA 1.015    Blood, UA Negative    pH, UA 6.0    Protein, UA Negative    Urobilinogen, UA 0.2    Nitrite, UA Negative    Leukocytes, UA Negative       Assessment & Plan:  Patient looks  great. He needs to continue with exercise and weight loss medications were refilled he has now received benefits through the [place] system. He has disability through them. He has significant arthritis and plans to have surgery at some time in the future. I encouraged him to wait until his pain is severe before having that surgery. He was given a handicap sticker because of his orthopedic problem.I personally performed the services described in  this documentation, which was scribed in my presence. The recorded information has been reviewed and is accurate.

## 2014-03-10 DIAGNOSIS — R739 Hyperglycemia, unspecified: Secondary | ICD-10-CM | POA: Insufficient documentation

## 2014-03-31 DIAGNOSIS — H5213 Myopia, bilateral: Secondary | ICD-10-CM | POA: Diagnosis not present

## 2014-03-31 DIAGNOSIS — H52203 Unspecified astigmatism, bilateral: Secondary | ICD-10-CM | POA: Diagnosis not present

## 2014-04-06 DIAGNOSIS — R972 Elevated prostate specific antigen [PSA]: Secondary | ICD-10-CM | POA: Diagnosis not present

## 2014-04-13 DIAGNOSIS — N401 Enlarged prostate with lower urinary tract symptoms: Secondary | ICD-10-CM | POA: Diagnosis not present

## 2014-04-13 DIAGNOSIS — R361 Hematospermia: Secondary | ICD-10-CM | POA: Diagnosis not present

## 2014-04-13 DIAGNOSIS — R972 Elevated prostate specific antigen [PSA]: Secondary | ICD-10-CM | POA: Diagnosis not present

## 2014-07-15 ENCOUNTER — Telehealth: Payer: Self-pay

## 2014-07-15 NOTE — Telephone Encounter (Signed)
A woman called into the voicemail on Thursday 07/15/14 at 10am, stating she was from the [place] and wanted to get records of Mr. Ogata most recent office visit and immunization records faxed to [phone]. She stated that he was a patient of Dr. Everlene Farrier. She did not leave a number to call back on or her name. Not sure where to go from here, please advise, tried looking up the fax number to get a telephone number but couldn't find one.

## 2014-07-16 NOTE — Telephone Encounter (Signed)
Tried to send a return fax to the number below stating we need a written authorization to process request but faxes have failed.

## 2014-07-22 ENCOUNTER — Encounter: Payer: Self-pay | Admitting: Emergency Medicine

## 2014-09-07 ENCOUNTER — Encounter: Payer: Self-pay | Admitting: Emergency Medicine

## 2014-09-07 ENCOUNTER — Ambulatory Visit (INDEPENDENT_AMBULATORY_CARE_PROVIDER_SITE_OTHER): Payer: Medicare Other | Admitting: Emergency Medicine

## 2014-09-07 VITALS — BP 111/66 | HR 69 | Temp 97.7°F | Resp 16 | Ht 70.0 in | Wt 242.6 lb

## 2014-09-07 DIAGNOSIS — R7309 Other abnormal glucose: Secondary | ICD-10-CM | POA: Diagnosis not present

## 2014-09-07 DIAGNOSIS — Z658 Other specified problems related to psychosocial circumstances: Secondary | ICD-10-CM

## 2014-09-07 DIAGNOSIS — R739 Hyperglycemia, unspecified: Secondary | ICD-10-CM | POA: Diagnosis not present

## 2014-09-07 DIAGNOSIS — E785 Hyperlipidemia, unspecified: Secondary | ICD-10-CM

## 2014-09-07 DIAGNOSIS — I10 Essential (primary) hypertension: Secondary | ICD-10-CM

## 2014-09-07 DIAGNOSIS — M1990 Unspecified osteoarthritis, unspecified site: Secondary | ICD-10-CM

## 2014-09-07 DIAGNOSIS — F439 Reaction to severe stress, unspecified: Secondary | ICD-10-CM

## 2014-09-07 LAB — LIPID PANEL
Cholesterol: 139 mg/dL (ref 0–200)
HDL: 38 mg/dL — ABNORMAL LOW (ref >=40)
LDL Cholesterol: 75 mg/dL (ref 0–99)
Total CHOL/HDL Ratio: 3.7 Ratio
Triglycerides: 131 mg/dL (ref <150)
VLDL: 26 mg/dL (ref 0–40)

## 2014-09-07 LAB — BASIC METABOLIC PANEL WITH GFR
BUN: 16 mg/dL (ref 6–23)
CO2: 27 mEq/L (ref 19–32)
Calcium: 9.9 mg/dL (ref 8.4–10.5)
Chloride: 103 mEq/L (ref 96–112)
Creat: 0.96 mg/dL (ref 0.50–1.35)
GFR, Est African American: >89 mL/min
GFR, Est Non African American: 80 mL/min
Glucose, Bld: 127 mg/dL — ABNORMAL HIGH (ref 70–99)
Potassium: 4.1 mEq/L (ref 3.5–5.3)
Sodium: 140 mEq/L (ref 135–145)

## 2014-09-07 LAB — CBC WITH DIFFERENTIAL/PLATELET
Basophils Absolute: 0.1 10*3/uL (ref 0.0–0.1)
Basophils Relative: 1 % (ref 0–1)
Eosinophils Absolute: 0.3 10*3/uL (ref 0.0–0.7)
Eosinophils Relative: 6 % — ABNORMAL HIGH (ref 0–5)
HCT: 42.4 % (ref 39.0–52.0)
Hemoglobin: 14.8 g/dL (ref 13.0–17.0)
LYMPHS PCT: 44 % (ref 12–46)
Lymphs Abs: 2.3 10*3/uL (ref 0.7–4.0)
MCH: 32.6 pg (ref 26.0–34.0)
MCHC: 34.9 g/dL (ref 30.0–36.0)
MCV: 93.4 fL (ref 78.0–100.0)
MPV: 10.2 fL (ref 8.6–12.4)
Monocytes Absolute: 0.6 10*3/uL (ref 0.1–1.0)
Monocytes Relative: 12 % (ref 3–12)
NEUTROS ABS: 1.9 10*3/uL (ref 1.7–7.7)
Neutrophils Relative %: 37 % — ABNORMAL LOW (ref 43–77)
Platelets: 263 10*3/uL (ref 150–400)
RBC: 4.54 MIL/uL (ref 4.22–5.81)
RDW: 13.9 % (ref 11.5–15.5)
WBC: 5.2 10*3/uL (ref 4.0–10.5)

## 2014-09-07 LAB — POCT GLYCOSYLATED HEMOGLOBIN (HGB A1C): Hemoglobin A1C: 6.3

## 2014-09-07 LAB — GLUCOSE, POCT (MANUAL RESULT ENTRY): POC Glucose: 129 mg/dl — AB (ref 70–99)

## 2014-09-07 MED ORDER — MELOXICAM 7.5 MG PO TABS: 7.5000 mg | ORAL_TABLET | Freq: Every day | ORAL | Status: DC

## 2014-09-07 NOTE — Progress Notes (Addendum)
Subjective:  This chart was scribed for Darlyne Russian, MD by Tamsen Roers, at [facility].  This patient was seen in room 23 and the patient's care was started at 8:40 AM.    Patient ID: Ryan Hernandez, male    DOB: [birth year], 69 y.o.   MRN: [id number]   Chief Complaint  Patient presents with  . Hypertension  . Hyperlipidemia    HPI  HPI Comments: Ryan Hernandez is a 69 y.o. male  With a history of diabetes presents to the [facility] for a follow up regarding his hypertension and hyperlipidemia. Patient had lab tests completed (in [place]) and is attending physical therapy (in [place]) for his knees beginning on the 29 th of this month. He is complaint with keeping track of his blood sugar and is taking his blood pressure medication.  He has not used his nitroglycerin in the past month and is no longer taking his sleeping medication.  Patient is also complaint with using his CPAP machine. Currently, he is working full time as a Brewing technologist.  He is up to date with his shingles vaccination.   Abdominal soreness: He also notes of an intermittent "sore spot" on his abdominal region.  He has not been to his GI doctor recently.     Patient Active Problem List   Diagnosis Date Noted  . Hyperglycemia 03/10/2014  . Obesity (BMI 30-39.9)   . Hyperlipidemia 03/04/2012  . Hypertension 03/04/2012  . CAD (coronary artery disease) 03/04/2012  . OSA on CPAP 03/04/2012  . Anxiety 03/04/2012  . History of colon cancer    Past Medical History  Diagnosis Date  . Allergy   . Anxiety   . Arthritis   . Blood transfusion   . Cancer     colon  . Heart aneurysm   . Depression   . GERD (gastroesophageal reflux disease)     hx of, no medications currently  . Hyperlipidemia   . Hypertension    Past Surgical History  Procedure Laterality Date  . Appendectomy    . Partial colectomy      colectomy  . Colonoscopy    . Fatty  tumors      right arm  . Excisional hemorrhoidectomy    . Tonsillectomy     Allergies  Allergen Reactions  . Ace Inhibitors Cough   Prior to Admission medications   Medication Sig Start Date End Date Taking? Authorizing Provider  ALPRAZolam Duanne Moron) 0.5 MG tablet Take 1 tablet (0.5 mg total) by mouth 2 (two) times daily as needed. 03/09/14   Darlyne Russian, MD  amLODipine (NORVASC) 10 MG tablet Take 1 tablet (10 mg total) by mouth daily. 03/09/14   Darlyne Russian, MD  aspirin 81 MG tablet Take 81 mg by mouth at bedtime.    Historical Provider, MD  atorvastatin (LIPITOR) 40 MG tablet Take 1 tablet daily at bedtime 03/09/14   Darlyne Russian, MD  losartan-hydrochlorothiazide (HYZAAR) 100-25 MG per tablet Take 1 tablet by mouth daily. 03/09/14   Darlyne Russian, MD  meclizine (ANTIVERT) 25 MG tablet TAKE ONE-HALF TO ONE TABLET BY MOUTH THREE TIMES DAILY AS NEEDED FOR DIZZINESS Patient not taking: Reported on 03/09/2014 04/14/13   Darlyne Russian, MD  meloxicam (MOBIC) 7.5 MG tablet Take 1 tablet (7.5 mg total) by mouth daily. 03/09/14   Darlyne Russian, MD  nitroGLYCERIN (NITROSTAT) 0.4 MG SL tablet Place 1 tablet (0.4 mg  total) under the tongue every 5 (five) minutes as needed. 03/20/12   Darlyne Russian, MD  polyethylene glycol (MIRALAX / GLYCOLAX) packet Take 17 g by mouth daily.      Historical Provider, MD  simvastatin (ZOCOR) 20 MG tablet 1 tablet Daily. 12/26/10   Historical Provider, MD  tamsulosin (FLOMAX) 0.4 MG CAPS capsule TAKE ONE CAPSULE BY MOUTH EVERY DAY AFTER  SUPPER 04/14/13   Darlyne Russian, MD  zolpidem (AMBIEN) 10 MG tablet Take 1 tablet (10 mg total) by mouth at bedtime. 03/09/14   Darlyne Russian, MD   History   Social History  . Marital Status: Married    Spouse Name: N/A  . Number of Children: N/A  . Years of Education: N/A   Occupational History  . Not on file.   Social History Main Topics  . Smoking status: Former Research scientist (life sciences)  . Smokeless tobacco: Not on file  . Alcohol Use: No   . Drug Use: No  . Sexual Activity: Not on file   Other Topics Concern  . Not on file   Social History Narrative     Current Outpatient Prescriptions on File Prior to Visit  Medication Sig Dispense Refill  . ALPRAZolam (XANAX) 0.5 MG tablet Take 1 tablet (0.5 mg total) by mouth 2 (two) times daily as needed. 60 tablet 5  . amLODipine (NORVASC) 10 MG tablet Take 1 tablet (10 mg total) by mouth daily. 30 tablet 11  . aspirin 81 MG tablet Take 81 mg by mouth at bedtime.    Marland Kitchen atorvastatin (LIPITOR) 40 MG tablet Take 1 tablet daily at bedtime 30 tablet 11  . losartan-hydrochlorothiazide (HYZAAR) 100-25 MG per tablet Take 1 tablet by mouth daily. 30 tablet 11  . meloxicam (MOBIC) 7.5 MG tablet Take 1 tablet (7.5 mg total) by mouth daily. 60 tablet 1  . nitroGLYCERIN (NITROSTAT) 0.4 MG SL tablet Place 1 tablet (0.4 mg total) under the tongue every 5 (five) minutes as needed. 30 tablet 11  . simvastatin (ZOCOR) 20 MG tablet 1 tablet Daily.    . tamsulosin (FLOMAX) 0.4 MG CAPS capsule TAKE ONE CAPSULE BY MOUTH EVERY DAY AFTER  SUPPER 30 capsule 4  . meclizine (ANTIVERT) 25 MG tablet TAKE ONE-HALF TO ONE TABLET BY MOUTH THREE TIMES DAILY AS NEEDED FOR DIZZINESS (Patient not taking: Reported on 03/09/2014) 30 tablet 4  . polyethylene glycol (MIRALAX / GLYCOLAX) packet Take 17 g by mouth daily.      Marland Kitchen zolpidem (AMBIEN) 10 MG tablet Take 1 tablet (10 mg total) by mouth at bedtime. (Patient not taking: Reported on 09/07/2014) 30 tablet 5   No current facility-administered medications on file prior to visit.    Allergies  Allergen Reactions  . Ace Inhibitors Cough       Review of Systems  Constitutional: Negative for fever and chills.  Eyes: Negative for pain, redness and itching.  Respiratory: Negative for cough and choking.   Gastrointestinal: Positive for abdominal pain. Negative for nausea and vomiting.  Musculoskeletal: Positive for arthralgias. Negative for neck pain and neck  stiffness.  Neurological: Negative for syncope and speech difficulty.       Objective:   Physical Exam  CONSTITUTIONAL: Well developed/well nourished HEAD: Normocephalic/atraumatic EYES: EOMI/PERRL ENMT: Mucous membranes moist NECK: supple no meningeal signs SPINE/BACK:entire spine nontender CV: S1/S2 noted, no murmurs/rubs/gallops noted, No carotid bruits LUNGS: Lungs are clear to auscultation bilaterally, no apparent distress ABDOMEN: He has approximately 4 cm incisional hernia just above the  umbilicus.  GU:no cva tenderness NEURO: Pt is awake/alert/appropriate, moves all extremitiesx4.  No facial droop.   EXTREMITIES: pulses normal/equal, full ROM SKIN: warm, color normal PSYCH: no abnormalities of mood noted, alert and oriented to situation     Filed Vitals:   09/07/14 0831  BP: 111/66  Pulse: 69  Temp: 97.7 F (36.5 C)  TempSrc: Oral  Resp: 16  Height: 5\' 10"  (1.778 m)  Weight: 242 lb 9.6 oz (110.043 kg)  SpO2: 96%   Results for orders placed or performed in visit on 09/07/14  POCT glucose (manual entry)  Result Value Ref Range   POC Glucose 129 (A) 70 - 99 mg/dl  POCT glycosylated hemoglobin (Hb A1C)  Result Value Ref Range   Hemoglobin A1C 6.3         Assessment & Plan:  1. Hyperglycemia Results for orders placed or performed in visit on 09/07/14  POCT glucose (manual entry)  Result Value Ref Range   POC Glucose 129 (A) 70 - 99 mg/dl  POCT glycosylated hemoglobin (Hb A1C)  Result Value Ref Range   Hemoglobin A1C 6.3    no change in treatment of his diabetes - POCT glucose (manual entry) - POCT glycosylated hemoglobin (Hb A1C) - CBC with Differential/Platelet  2. Hyperlipidemia  - Lipid panel - CBC with Differential/Platelet  3. Essential hypertension Blood pressure is at goal - BASIC METABOLIC PANEL WITH GFR - CBC with Differential/Platelet  4. Stress Significant stress related to a daughter who has mental illness and has been  hospitalized for that in the past. He has returned to work full duty and is very comfortable with that. His wife is doing well. 5. Arthritis I did refill his Mobic he is having physical therapy through the [place] system in [place] I personally performed the services described in this documentation, which was scribed in my presence. The recorded information has been reviewed and is accurate.  Arlyss Queen, MD  [facility]  09/07/2014 9:33 AM

## 2014-09-28 DIAGNOSIS — N401 Enlarged prostate with lower urinary tract symptoms: Secondary | ICD-10-CM | POA: Diagnosis not present

## 2014-09-28 DIAGNOSIS — R972 Elevated prostate specific antigen [PSA]: Secondary | ICD-10-CM | POA: Diagnosis not present

## 2014-10-03 ENCOUNTER — Other Ambulatory Visit: Payer: Self-pay | Admitting: Emergency Medicine

## 2014-10-05 DIAGNOSIS — N138 Other obstructive and reflux uropathy: Secondary | ICD-10-CM | POA: Diagnosis not present

## 2014-10-05 DIAGNOSIS — N401 Enlarged prostate with lower urinary tract symptoms: Secondary | ICD-10-CM | POA: Diagnosis not present

## 2014-10-05 DIAGNOSIS — R972 Elevated prostate specific antigen [PSA]: Secondary | ICD-10-CM | POA: Diagnosis not present

## 2014-10-06 NOTE — Telephone Encounter (Signed)
Faxed

## 2014-12-28 ENCOUNTER — Encounter: Payer: Self-pay | Admitting: Emergency Medicine

## 2015-01-18 DIAGNOSIS — I10 Essential (primary) hypertension: Secondary | ICD-10-CM | POA: Diagnosis not present

## 2015-01-18 DIAGNOSIS — I2510 Atherosclerotic heart disease of native coronary artery without angina pectoris: Secondary | ICD-10-CM | POA: Diagnosis not present

## 2015-01-18 DIAGNOSIS — E785 Hyperlipidemia, unspecified: Secondary | ICD-10-CM | POA: Diagnosis not present

## 2015-01-18 DIAGNOSIS — E668 Other obesity: Secondary | ICD-10-CM | POA: Diagnosis not present

## 2015-01-20 DIAGNOSIS — Z23 Encounter for immunization: Secondary | ICD-10-CM | POA: Diagnosis not present

## 2015-02-02 ENCOUNTER — Other Ambulatory Visit: Payer: Self-pay | Admitting: Emergency Medicine

## 2015-03-04 ENCOUNTER — Other Ambulatory Visit: Payer: Self-pay | Admitting: Emergency Medicine

## 2015-03-08 ENCOUNTER — Ambulatory Visit: Payer: Medicare Other | Admitting: Emergency Medicine

## 2015-03-26 ENCOUNTER — Other Ambulatory Visit: Payer: Self-pay | Admitting: Emergency Medicine

## 2015-03-28 NOTE — Telephone Encounter (Signed)
Rx called in 

## 2015-05-03 ENCOUNTER — Other Ambulatory Visit: Payer: Self-pay | Admitting: Emergency Medicine

## 2015-06-08 ENCOUNTER — Ambulatory Visit (INDEPENDENT_AMBULATORY_CARE_PROVIDER_SITE_OTHER): Payer: Medicare Other | Admitting: Physician Assistant

## 2015-06-08 VITALS — BP 131/76 | HR 83 | Temp 97.0°F | Resp 16 | Ht 69.0 in | Wt 248.0 lb

## 2015-06-08 DIAGNOSIS — R21 Rash and other nonspecific skin eruption: Secondary | ICD-10-CM

## 2015-06-08 DIAGNOSIS — I10 Essential (primary) hypertension: Secondary | ICD-10-CM | POA: Diagnosis not present

## 2015-06-08 LAB — COMPLETE METABOLIC PANEL WITH GFR
ALT: 26 U/L (ref 9–46)
AST: 19 U/L (ref 10–35)
Albumin: 4.6 g/dL (ref 3.6–5.1)
Alkaline Phosphatase: 42 U/L (ref 40–115)
BUN: 20 mg/dL (ref 7–25)
CALCIUM: 9.8 mg/dL (ref 8.6–10.3)
CHLORIDE: 102 mmol/L (ref 98–110)
CO2: 30 mmol/L (ref 20–31)
CREATININE: 1.02 mg/dL (ref 0.70–1.18)
GFR, Est African American: 86 mL/min (ref >=60)
GFR, Est Non African American: 74 mL/min (ref >=60)
GLUCOSE: 193 mg/dL — AB (ref 65–99)
Potassium: 4 mmol/L (ref 3.5–5.3)
SODIUM: 141 mmol/L (ref 135–146)
Total Bilirubin: 1.5 mg/dL — ABNORMAL HIGH (ref 0.2–1.2)
Total Protein: 7 g/dL (ref 6.1–8.1)

## 2015-06-08 MED ORDER — AMLODIPINE BESYLATE 10 MG PO TABS: 10.0000 mg | ORAL_TABLET | Freq: Every day | ORAL | Status: AC

## 2015-06-08 MED ORDER — ATORVASTATIN CALCIUM 40 MG PO TABS: 40.0000 mg | ORAL_TABLET | Freq: Every day | ORAL | Status: AC

## 2015-06-08 MED ORDER — LOSARTAN POTASSIUM-HCTZ 100-25 MG PO TABS: 1.0000 | ORAL_TABLET | Freq: Every day | ORAL | Status: DC

## 2015-06-08 MED ORDER — TRIAMCINOLONE ACETONIDE 0.1 % EX CREA: 1.0000 "application " | TOPICAL_CREAM | Freq: Two times a day (BID) | CUTANEOUS | Status: DC

## 2015-06-08 NOTE — Progress Notes (Signed)
[facility] [address] [place] [address] [phone]  Date:  06/08/2015   Name:  Ryan Hernandez   DOB:  24-Oct-1945   MRN:  [id number]  PCP:  Jenny Reichmann, MD  Chief Complaint  Patient presents with  . Medication Refill    Amlodipine 10mg  , Losartan Potassium-HTCZ 100-25mg       History of Present Illness:  Ryan Hernandez is a 70 y.o. male patient who presents to [facility] for medication refill of anti-hypertensives.  He does not check his blood pressure at home.   --No chest pains, sob, dizziness, or leg swelling.   --He is attempting to perform a more anti-hypertensive diet.   Patient Active Problem List   Diagnosis Date Noted  . Hyperglycemia 03/10/2014  . Obesity (BMI 30-39.9)   . Hyperlipidemia 03/04/2012  . Hypertension 03/04/2012  . CAD (coronary artery disease) 03/04/2012  . OSA on CPAP 03/04/2012  . Anxiety 03/04/2012  . History of colon cancer     Past Medical History  Diagnosis Date  . Allergy   . Anxiety   . Arthritis   . Blood transfusion   . Cancer ([place])     colon  . Heart aneurysm   . Depression   . GERD (gastroesophageal reflux disease)     hx of, no medications currently  . Hyperlipidemia   . Hypertension     Past Surgical History  Procedure Laterality Date  . Appendectomy    . Partial colectomy      colectomy  . Colonoscopy    . Fatty tumors      right arm  . Excisional hemorrhoidectomy    . Tonsillectomy      Social History  Substance Use Topics  . Smoking status: Former Research scientist (life sciences)  . Smokeless tobacco: Never Used  . Alcohol Use: No    Family History  Problem Relation Age of Onset  . Leukemia Mother   . Colon cancer Neg Hx   . Esophageal cancer Neg Hx   . Stomach cancer Neg Hx   . Heart disease Father     Allergies  Allergen Reactions  . Ace Inhibitors Cough    Medication list has been reviewed and updated.  Current Outpatient Prescriptions on File Prior to Visit  Medication Sig Dispense Refill   . ALPRAZolam (XANAX) 0.5 MG tablet TAKE ONE TABLET BY MOUTH TWICE DAILY AS NEEDED 60 tablet 0  . amLODipine (NORVASC) 10 MG tablet TAKE ONE TABLET BY MOUTH ONCE DAILY 30 tablet 0  . aspirin 81 MG tablet Take 81 mg by mouth at bedtime.    Marland Kitchen atorvastatin (LIPITOR) 40 MG tablet TAKE ONE TABLET BY MOUTH AT BEDTIME NEED  OFFICE  VISIT  FOR  MORE  REFILLS 30 tablet 0  . Cholecalciferol 10000 UNITS CAPS Take 1,000 Units by mouth.    . finasteride (PROSCAR) 5 MG tablet Take 5 mg by mouth daily.    Marland Kitchen losartan-hydrochlorothiazide (HYZAAR) 100-25 MG tablet TAKE ONE TABLET BY MOUTH ONCE DAILY NO  MORE  REFILLS  WITHOUT  OFFICE  VISIT 30 tablet 0  . meclizine (ANTIVERT) 25 MG tablet TAKE ONE-HALF TO ONE TABLET BY MOUTH THREE TIMES DAILY AS NEEDED FOR DIZZINESS 30 tablet 4  . meloxicam (MOBIC) 7.5 MG tablet Take 1 tablet (7.5 mg total) by mouth daily. 60 tablet 1  . nitroGLYCERIN (NITROSTAT) 0.4 MG SL tablet Place 1 tablet (0.4 mg total) under the tongue every 5 (five) minutes as needed. 30 tablet  11  . polyethylene glycol (MIRALAX / GLYCOLAX) packet Take 17 g by mouth daily.      . Psyllium (METAMUCIL PO) Take by mouth.    . simvastatin (ZOCOR) 20 MG tablet 1 tablet Daily.    . tamsulosin (FLOMAX) 0.4 MG CAPS capsule TAKE ONE CAPSULE BY MOUTH EVERY DAY AFTER  SUPPER 30 capsule 4  . zolpidem (AMBIEN) 10 MG tablet TAKE ONE TABLET BY MOUTH AT BEDTIME AS NEEDED FOR SLEEP 30 tablet 0   No current facility-administered medications on file prior to visit.    ROS ROS otherwise unremarkable unless listed above.   Physical Examination: BP 131/76 mmHg  Pulse 83  Temp(Src) 97 F (36.1 C) (Oral)  Resp 16  Ht 5\' 9"  (1.753 m)  Wt 248 lb (112.492 kg)  BMI 36.61 kg/m2  SpO2 97% Ideal Body Weight: Weight in (lb) to have BMI = 25: 168.9  Physical Exam  Constitutional: He is oriented to person, place, and time. He appears well-developed and well-nourished. No distress.  HENT:  Head: Normocephalic and  atraumatic.  Eyes: Conjunctivae and EOM are normal. Pupils are equal, round, and reactive to light.  Cardiovascular: Normal rate, regular rhythm and normal heart sounds.  Exam reveals no gallop and no friction rub.   No murmur heard. Pulses:      Radial pulses are 2+ on the right side, and 2+ on the left side.       Dorsalis pedis pulses are 2+ on the right side, and 2+ on the left side.  Pulmonary/Chest: Effort normal. No respiratory distress.  Neurological: He is alert and oriented to person, place, and time.  Skin: Skin is warm and dry. He is not diaphoretic.  Psychiatric: He has a normal mood and affect. His behavior is normal.     Assessment and Plan: Ryan Hernandez is a 70 y.o. male who is here today for medication refill, and rash.  Essential hypertension - Plan: CBC, COMPLETE METABOLIC PANEL WITH GFR, amLODipine (NORVASC) 10 MG tablet, atorvastatin (LIPITOR) 40 MG tablet, losartan-hydrochlorothiazide (HYZAAR) 100-25 MG tablet  Rash and nonspecific skin eruption - Plan: triamcinolone cream (KENALOG) 0.1 %  Ivar Drape, PA-C [facility] 3/15/20178:48 PM

## 2015-06-08 NOTE — Patient Instructions (Addendum)
IF you received an x-ray today, you will receive an invoice from [facility] Rad[facility]. Please contact [facility] at [phone] with questions or concerns regarding your invoice.   IF you received labwork today, you will receive an invoice from [employer]. Please contact [facility] at [phone] with questions or concerns regarding your invoice.   Our billing staff will not be able to assist you with questions regarding bills from these companies.  You will be contacted with the lab results as soon as they are available. The fastest way to get your results is to activate your My Chart account. Instructions are located on the last page of this paperwork. If you have not heard from [place] regarding the results in 2 weeks, please contact this office.   Please use the cream for the rash no longer than 2 weeks.  I would like you to moisturize your skin daily.  You can use eucerin, cetaphil, or the aquafor.  If this does not have improvement, then you should let me know, and we will put in a referral to dermatology.    DASH Eating Plan DASH stands for "Dietary Approaches to Stop Hypertension." The DASH eating plan is a healthy eating plan that has been shown to reduce high blood pressure (hypertension). Additional health benefits may include reducing the risk of type 2 diabetes mellitus, heart disease, and stroke. The DASH eating plan may also help with weight loss. WHAT DO I NEED TO KNOW ABOUT THE DASH EATING PLAN? For the DASH eating plan, you will follow these general guidelines:  Choose foods with a percent daily value for sodium of less than 5% (as listed on the food label).  Use salt-free seasonings or herbs instead of table salt or sea salt.  Check with your health care provider or pharmacist before using salt substitutes.  Eat lower-sodium products, often labeled as "lower sodium" or "no salt added."  Eat fresh foods.  Eat more vegetables, fruits, and  low-fat dairy products.  Choose whole grains. Look for the word "whole" as the first word in the ingredient list.  Choose fish and skinless chicken or [place] more often than red meat. Limit fish, poultry, and meat to 6 oz (170 g) each day.  Limit sweets, desserts, sugars, and sugary drinks.  Choose heart-healthy fats.  Limit cheese to 1 oz (28 g) per day.  Eat more home-cooked food and less restaurant, buffet, and fast food.  Limit fried foods.  Cook foods using methods other than frying.  Limit canned vegetables. If you do use them, rinse them well to decrease the sodium.  When eating at a restaurant, ask that your food be prepared with less salt, or no salt if possible. WHAT FOODS CAN I EAT? Seek help from a dietitian for individual calorie needs. Grains Whole grain or whole wheat bread. Brown rice. Whole grain or whole wheat pasta. Quinoa, bulgur, and whole grain cereals. Low-sodium cereals. Corn or whole wheat flour tortillas. Whole grain cornbread. Whole grain crackers. Low-sodium crackers. Vegetables Fresh or frozen vegetables (raw, steamed, roasted, or grilled). Low-sodium or reduced-sodium tomato and vegetable juices. Low-sodium or reduced-sodium tomato sauce and paste. Low-sodium or reduced-sodium canned vegetables.  Fruits All fresh, canned (in natural juice), or frozen fruits. Meat and Other Protein Products Ground beef (85% or leaner), grass-fed beef, or beef trimmed of fat. Skinless chicken or [place]. Ground chicken or [place]. Pork trimmed of fat. All fish and seafood. Eggs. Dried beans, peas, or lentils. Unsalted nuts and seeds. Unsalted  canned beans. Dairy Low-fat dairy products, such as skim or 1% milk, 2% or reduced-fat cheeses, low-fat ricotta or cottage cheese, or plain low-fat yogurt. Low-sodium or reduced-sodium cheeses. Fats and Oils Tub margarines without trans fats. Light or reduced-fat mayonnaise and salad dressings (reduced sodium). Avocado. Safflower,  olive, or canola oils. Natural peanut or almond butter. Other Unsalted popcorn and pretzels. The items listed above may not be a complete list of recommended foods or beverages. Contact your dietitian for more options. WHAT FOODS ARE NOT RECOMMENDED? Grains White bread. White pasta. White rice. Refined cornbread. Bagels and croissants. Crackers that contain trans fat. Vegetables Creamed or fried vegetables. Vegetables in a cheese sauce. Regular canned vegetables. Regular canned tomato sauce and paste. Regular tomato and vegetable juices. Fruits Dried fruits. Canned fruit in light or heavy syrup. Fruit juice. Meat and Other Protein Products Fatty cuts of meat. Ribs, chicken wings, bacon, sausage, bologna, salami, chitterlings, fatback, hot dogs, bratwurst, and packaged luncheon meats. Salted nuts and seeds. Canned beans with salt. Dairy Whole or 2% milk, cream, half-and-half, and cream cheese. Whole-fat or sweetened yogurt. Full-fat cheeses or blue cheese. Nondairy creamers and whipped toppings. Processed cheese, cheese spreads, or cheese curds. Condiments Onion and garlic salt, seasoned salt, table salt, and sea salt. Canned and packaged gravies. Worcestershire sauce. Tartar sauce. Barbecue sauce. Teriyaki sauce. Soy sauce, including reduced sodium. Steak sauce. Fish sauce. Oyster sauce. Cocktail sauce. Horseradish. Ketchup and mustard. Meat flavorings and tenderizers. Bouillon cubes. Hot sauce. Tabasco sauce. Marinades. Taco seasonings. Relishes. Fats and Oils Butter, stick margarine, lard, shortening, ghee, and bacon fat. Coconut, palm kernel, or palm oils. Regular salad dressings. Other Pickles and olives. Salted popcorn and pretzels. The items listed above may not be a complete list of foods and beverages to avoid. Contact your dietitian for more information. WHERE CAN I FIND MORE INFORMATION? National Heart, Lung, and Blood Institute: travelstabloid.com    This information is not intended to replace advice given to you by your health care provider. Make sure you discuss any questions you have with your health care provider.   Document Released: 03/01/2011 Document Revised: 04/02/2014 Document Reviewed: 01/14/2013 Elsevier Interactive Patient Education [employer].

## 2015-06-09 LAB — CBC
HEMATOCRIT: 42.4 % (ref 39.0–52.0)
Hemoglobin: 14.4 g/dL (ref 13.0–17.0)
MCH: 31.9 pg (ref 26.0–34.0)
MCHC: 34 g/dL (ref 30.0–36.0)
MCV: 94 fL (ref 78.0–100.0)
MPV: 9.5 fL (ref 8.6–12.4)
Platelets: 264 10*3/uL (ref 150–400)
RBC: 4.51 MIL/uL (ref 4.22–5.81)
RDW: 14.1 % (ref 11.5–15.5)
WBC: 7.4 10*3/uL (ref 4.0–10.5)

## 2015-07-07 ENCOUNTER — Telehealth: Payer: Self-pay

## 2015-07-07 NOTE — Telephone Encounter (Signed)
Patient came to drop off lab results for labs done outside of our office. Papers have been placed in the [facility] box.

## 2015-07-12 NOTE — Telephone Encounter (Signed)
I can not find this paperwork.  Can you or someone locate this for me.

## 2015-07-21 DIAGNOSIS — I10 Essential (primary) hypertension: Secondary | ICD-10-CM | POA: Diagnosis not present

## 2015-07-21 DIAGNOSIS — E668 Other obesity: Secondary | ICD-10-CM | POA: Diagnosis not present

## 2015-07-21 DIAGNOSIS — E785 Hyperlipidemia, unspecified: Secondary | ICD-10-CM | POA: Diagnosis not present

## 2015-07-21 DIAGNOSIS — I2510 Atherosclerotic heart disease of native coronary artery without angina pectoris: Secondary | ICD-10-CM | POA: Diagnosis not present

## 2015-07-21 DIAGNOSIS — Z01810 Encounter for preprocedural cardiovascular examination: Secondary | ICD-10-CM | POA: Diagnosis not present

## 2015-11-22 ENCOUNTER — Other Ambulatory Visit: Payer: Self-pay

## 2016-01-04 ENCOUNTER — Encounter: Payer: Self-pay | Admitting: Internal Medicine

## 2016-01-09 HISTORY — PX: EXCISIONAL TOTAL KNEE ARTHROPLASTY: SHX5015

## 2016-01-26 ENCOUNTER — Encounter: Payer: Self-pay | Admitting: Internal Medicine

## 2016-03-29 ENCOUNTER — Ambulatory Visit (AMBULATORY_SURGERY_CENTER): Payer: Self-pay

## 2016-03-29 VITALS — Ht 70.0 in | Wt 248.2 lb

## 2016-03-29 DIAGNOSIS — Z85038 Personal history of other malignant neoplasm of large intestine: Secondary | ICD-10-CM

## 2016-03-29 NOTE — Progress Notes (Signed)
Per pt, no allergies to soy or egg products.Pt not taking any weight loss meds or using  O2 at home.    Suprep bowel prep 1 kit called into [facility] at [phone]. Spoke with Tasha. They can not fill Suprep, only [facility]. Spoke with pt. He will come to our office to pick up a sample of the Suprep today. Suprep bowel prep Lot #[id number], Exp 10/19 will be left at the front desk for the pt. He will call back if he has questions.

## 2016-04-09 ENCOUNTER — Encounter: Payer: Self-pay | Admitting: Internal Medicine

## 2016-04-09 ENCOUNTER — Ambulatory Visit (AMBULATORY_SURGERY_CENTER): Payer: Medicare Other | Admitting: Internal Medicine

## 2016-04-09 VITALS — BP 111/71 | HR 60 | Temp 97.5°F | Resp 11 | Ht 70.0 in | Wt 248.0 lb

## 2016-04-09 DIAGNOSIS — D123 Benign neoplasm of transverse colon: Secondary | ICD-10-CM

## 2016-04-09 DIAGNOSIS — Z85038 Personal history of other malignant neoplasm of large intestine: Secondary | ICD-10-CM

## 2016-04-09 DIAGNOSIS — D124 Benign neoplasm of descending colon: Secondary | ICD-10-CM | POA: Diagnosis not present

## 2016-04-09 DIAGNOSIS — Z86010 Personal history of colonic polyps: Secondary | ICD-10-CM | POA: Diagnosis not present

## 2016-04-09 DIAGNOSIS — I2510 Atherosclerotic heart disease of native coronary artery without angina pectoris: Secondary | ICD-10-CM | POA: Diagnosis not present

## 2016-04-09 DIAGNOSIS — G4733 Obstructive sleep apnea (adult) (pediatric): Secondary | ICD-10-CM | POA: Diagnosis not present

## 2016-04-09 DIAGNOSIS — E669 Obesity, unspecified: Secondary | ICD-10-CM | POA: Diagnosis not present

## 2016-04-09 DIAGNOSIS — I10 Essential (primary) hypertension: Secondary | ICD-10-CM | POA: Diagnosis not present

## 2016-04-09 LAB — GLUCOSE, CAPILLARY
Glucose-Capillary: 116 mg/dL — ABNORMAL HIGH (ref 65–99)
Glucose-Capillary: 104 mg/dL — ABNORMAL HIGH (ref 65–99)

## 2016-04-09 MED ORDER — SODIUM CHLORIDE 0.9 % IV SOLN: 500.0000 mL | INTRAVENOUS | Status: DC

## 2016-04-09 NOTE — Progress Notes (Signed)
To recovery, report to RN, VSS. 

## 2016-04-09 NOTE — Patient Instructions (Signed)
Impression/Recommendations:  Polyp handout given to patient. Hemorrhoid handout given to patient. Diverticulosis handout given to patient.  Repeat colonoscopy in 3-5 years for surveillance.  YOU HAD AN ENDOSCOPIC PROCEDURE TODAY AT [facility]:   Refer to the procedure report that was given to you for any specific questions about what was found during the examination.  If the procedure report does not answer your questions, please call your gastroenterologist to clarify.  If you requested that your care partner not be given the details of your procedure findings, then the procedure report has been included in a sealed envelope for you to review at your convenience later.  YOU SHOULD EXPECT: Some feelings of bloating in the abdomen. Passage of more gas than usual.  Walking can help get rid of the air that was put into your GI tract during the procedure and reduce the bloating. If you had a lower endoscopy (such as a colonoscopy or flexible sigmoidoscopy) you may notice spotting of blood in your stool or on the toilet paper. If you underwent a bowel prep for your procedure, you may not have a normal bowel movement for a few days.  Please Note:  You might notice some irritation and congestion in your nose or some drainage.  This is from the oxygen used during your procedure.  There is no need for concern and it should clear up in a day or so.  SYMPTOMS TO REPORT IMMEDIATELY:   Following lower endoscopy (colonoscopy or flexible sigmoidoscopy):  Excessive amounts of blood in the stool  Significant tenderness or worsening of abdominal pains  Swelling of the abdomen that is new, acute  Fever of 100F or higher For urgent or emergent issues, a gastroenterologist can be reached at any hour by calling [phone].   DIET:  We do recommend a small meal at first, but then you may proceed to your regular diet.  Drink plenty of fluids but you should avoid alcoholic beverages for 24  hours.  ACTIVITY:  You should plan to take it easy for the rest of today and you should NOT DRIVE or use heavy machinery until tomorrow (because of the sedation medicines used during the test).    FOLLOW UP: Our staff will call the number listed on your records the next business day following your procedure to check on you and address any questions or concerns that you may have regarding the information given to you following your procedure. If we do not reach you, we will leave a message.  However, if you are feeling well and you are not experiencing any problems, there is no need to return our call.  We will assume that you have returned to your regular daily activities without incident.  If any biopsies were taken you will be contacted by phone or by letter within the next 1-3 weeks.  Please call us at [phone] if you have not heard about the biopsies in 3 weeks.    SIGNATURES/CONFIDENTIALITY: You and/or your care partner have signed paperwork which will be entered into your electronic medical record.  These signatures attest to the fact that that the information above on your After Visit Summary has been reviewed and is understood.  Full responsibility of the confidentiality of this discharge information lies with you and/or your care-partner.

## 2016-04-09 NOTE — Op Note (Signed)
[facility] Patient Name: Jaevion Faga Procedure Date: 04/09/2016 11:11 AM MRN: [id number] Endoscopist: Docia Chuck. Henrene Pastor , MD Age: 71 Referring MD:  Date of Birth: 10/15/1945 Gender: Male Account #: [id number] Procedure:                Colonoscopy, with cold snare polypectomy X3 Indications:              High risk colon cancer surveillance: Personal                            history of colon cancer Diagnosed 2003 status post                            right hemicolectomy. Multiple follow-up exams. Most                            recently October 2010 with one small polyp Medicines:                Monitored Anesthesia Care Procedure:                Pre-Anesthesia Assessment:                           - Prior to the procedure, a History and Physical                            was performed, and patient medications and                            allergies were reviewed. The patient's tolerance of                            previous anesthesia was also reviewed. The risks                            and benefits of the procedure and the sedation                            options and risks were discussed with the patient.                            All questions were answered, and informed consent                            was obtained. Prior Anticoagulants: The patient has                            taken no previous anticoagulant or antiplatelet                            agents. ASA Grade Assessment: II - A patient with                            mild systemic disease. After reviewing the risks  and benefits, the patient was deemed in                            satisfactory condition to undergo the procedure.                           After obtaining informed consent, the colonoscope                            was passed under direct vision. Throughout the                            procedure, the patient's blood pressure, pulse, and            oxygen saturations were monitored continuously. The                            Colonoscope was introduced through the anus and                            advanced to the the ileocolonic anastomosis. The                            rectum was photographed. The quality of the bowel                            preparation was excellent. The colonoscopy was                            performed without difficulty. The patient tolerated                            the procedure well. The bowel preparation used was                            SUPREP. Scope In: 11:22:22 AM Scope Out: 11:35:14 AM Scope Withdrawal Time: 0 hours 11 minutes 38 seconds  Total Procedure Duration: 0 hours 12 minutes 52 seconds  Findings:                 Three polyps were found in the descending colon and                            transverse colon. The polyps were 3 to 5 mm in                            size. These polyps were removed with a cold snare.                            Resection and retrieval were complete.                           Multiple diverticula were found in the transverse  colon and left colon.                           Internal hemorrhoids were found during retroflexion.                           There was evidence of a prior end-to-side                            ileo-colonic anastomosis in the ascending colon.                            The anastomosis was traversed. Complications:            No immediate complications. Estimated blood loss:                            None. Estimated Blood Loss:     Estimated blood loss: none. Impression:               - Three 3 to 5 mm polyps in the descending colon                            and in the transverse colon, removed with a cold                            snare. Resected and retrieved.                           - Diverticulosis in the transverse colon and in the                            left colon.                            - Internal hemorrhoids.                           - End-to-side ileo-colonic anastomosis. Recommendation:           - Repeat colonoscopy in 3 - 5 years for                            surveillance.                           - Patient has a contact number available for                            emergencies. The signs and symptoms of potential                            delayed complications were discussed with the                            patient. Return to normal activities tomorrow.  Written discharge instructions were provided to the                            patient.                           - Resume previous diet.                           - Continue present medications.                           - Await pathology results. Docia Chuck. Henrene Pastor, MD 04/09/2016 11:45:04 AM This report has been signed electronically.

## 2016-04-09 NOTE — Progress Notes (Signed)
Called to room to assist during endoscopic procedure.  Patient ID and intended procedure confirmed with present staff. Received instructions for my participation in the procedure from the performing physician.  

## 2016-04-10 ENCOUNTER — Telehealth: Payer: Self-pay

## 2016-04-10 NOTE — Telephone Encounter (Signed)
  Follow up Call-  Call back number 04/09/2016  Post procedure Call Back phone  # [phone]  Permission to leave phone message Yes  Some recent data might be hidden     Patient questions:  Do you have a fever, pain , or abdominal swelling? No. Pain Score  0 *  Have you tolerated food without any problems? Yes.    Have you been able to return to your normal activities? Yes.    Do you have any questions about your discharge instructions: Diet   No. Medications  No. Follow up visit  No.  Do you have questions or concerns about your Care? No.  Actions: * If pain score is 4 or above: No action needed, pain <4.

## 2016-04-12 ENCOUNTER — Encounter: Payer: Self-pay | Admitting: Internal Medicine

## 2016-07-17 DIAGNOSIS — I10 Essential (primary) hypertension: Secondary | ICD-10-CM | POA: Diagnosis not present

## 2016-07-17 DIAGNOSIS — I2510 Atherosclerotic heart disease of native coronary artery without angina pectoris: Secondary | ICD-10-CM | POA: Diagnosis not present

## 2016-07-17 DIAGNOSIS — E668 Other obesity: Secondary | ICD-10-CM | POA: Diagnosis not present

## 2016-07-17 DIAGNOSIS — E785 Hyperlipidemia, unspecified: Secondary | ICD-10-CM | POA: Diagnosis not present

## 2016-12-26 DIAGNOSIS — Z23 Encounter for immunization: Secondary | ICD-10-CM | POA: Diagnosis not present

## 2017-03-26 HISTORY — PX: CYST EXCISION: SHX5701

## 2017-05-24 DIAGNOSIS — M7138 Other bursal cyst, other site: Secondary | ICD-10-CM | POA: Insufficient documentation

## 2017-05-24 DIAGNOSIS — M5416 Radiculopathy, lumbar region: Secondary | ICD-10-CM | POA: Insufficient documentation

## 2017-05-24 DIAGNOSIS — M47816 Spondylosis without myelopathy or radiculopathy, lumbar region: Secondary | ICD-10-CM | POA: Insufficient documentation

## 2017-06-05 DIAGNOSIS — C189 Malignant neoplasm of colon, unspecified: Secondary | ICD-10-CM | POA: Insufficient documentation

## 2017-06-05 DIAGNOSIS — E119 Type 2 diabetes mellitus without complications: Secondary | ICD-10-CM | POA: Insufficient documentation

## 2017-06-05 DIAGNOSIS — I714 Abdominal aortic aneurysm, without rupture, unspecified: Secondary | ICD-10-CM | POA: Insufficient documentation

## 2017-06-05 DIAGNOSIS — M1990 Unspecified osteoarthritis, unspecified site: Secondary | ICD-10-CM | POA: Insufficient documentation

## 2017-06-05 DIAGNOSIS — E559 Vitamin D deficiency, unspecified: Secondary | ICD-10-CM | POA: Insufficient documentation

## 2017-07-04 DIAGNOSIS — Z4889 Encounter for other specified surgical aftercare: Secondary | ICD-10-CM | POA: Insufficient documentation

## 2017-09-02 DIAGNOSIS — I10 Essential (primary) hypertension: Secondary | ICD-10-CM | POA: Diagnosis not present

## 2017-09-02 DIAGNOSIS — I2510 Atherosclerotic heart disease of native coronary artery without angina pectoris: Secondary | ICD-10-CM | POA: Diagnosis not present

## 2017-09-02 DIAGNOSIS — E668 Other obesity: Secondary | ICD-10-CM | POA: Diagnosis not present

## 2017-09-02 DIAGNOSIS — Z01810 Encounter for preprocedural cardiovascular examination: Secondary | ICD-10-CM | POA: Diagnosis not present

## 2017-09-02 DIAGNOSIS — E785 Hyperlipidemia, unspecified: Secondary | ICD-10-CM | POA: Diagnosis not present

## 2017-11-06 DIAGNOSIS — Z96652 Presence of left artificial knee joint: Secondary | ICD-10-CM | POA: Insufficient documentation

## 2018-12-22 DIAGNOSIS — Z23 Encounter for immunization: Secondary | ICD-10-CM | POA: Diagnosis not present

## 2019-04-24 ENCOUNTER — Encounter: Payer: Self-pay | Admitting: Internal Medicine

## 2019-05-18 ENCOUNTER — Ambulatory Visit (AMBULATORY_SURGERY_CENTER): Payer: Self-pay | Admitting: *Deleted

## 2019-05-18 ENCOUNTER — Other Ambulatory Visit: Payer: Self-pay

## 2019-05-18 VITALS — Temp 97.6°F | Ht 70.0 in | Wt 232.0 lb

## 2019-05-18 DIAGNOSIS — Z01818 Encounter for other preprocedural examination: Secondary | ICD-10-CM

## 2019-05-18 DIAGNOSIS — Z86010 Personal history of colonic polyps: Secondary | ICD-10-CM

## 2019-05-18 MED ORDER — NA SULFATE-K SULFATE-MG SULF 17.5-3.13-1.6 GM/177ML PO SOLN: 1.0000 | Freq: Once | ORAL | 0 refills | Status: AC

## 2019-05-18 NOTE — Addendum Note (Signed)
Addended by: Christell Constant F on: 05/18/2019 11:32 AM   Modules accepted: Level of Service

## 2019-05-18 NOTE — Progress Notes (Signed)

## 2019-05-27 ENCOUNTER — Ambulatory Visit (INDEPENDENT_AMBULATORY_CARE_PROVIDER_SITE_OTHER): Payer: Medicare Other

## 2019-05-27 ENCOUNTER — Other Ambulatory Visit: Payer: Self-pay | Admitting: Internal Medicine

## 2019-05-27 DIAGNOSIS — Z1159 Encounter for screening for other viral diseases: Secondary | ICD-10-CM | POA: Diagnosis not present

## 2019-05-27 LAB — SARS CORONAVIRUS 2 (TAT 6-24 HRS): SARS Coronavirus 2: NEGATIVE

## 2019-06-01 ENCOUNTER — Encounter: Payer: Self-pay | Admitting: Internal Medicine

## 2019-06-01 ENCOUNTER — Other Ambulatory Visit: Payer: Self-pay

## 2019-06-01 ENCOUNTER — Ambulatory Visit (AMBULATORY_SURGERY_CENTER): Payer: Medicare Other | Admitting: Internal Medicine

## 2019-06-01 VITALS — BP 121/76 | HR 57 | Temp 97.1°F | Resp 14 | Ht 70.0 in | Wt 232.0 lb

## 2019-06-01 DIAGNOSIS — D123 Benign neoplasm of transverse colon: Secondary | ICD-10-CM | POA: Diagnosis not present

## 2019-06-01 DIAGNOSIS — D124 Benign neoplasm of descending colon: Secondary | ICD-10-CM

## 2019-06-01 DIAGNOSIS — Z85038 Personal history of other malignant neoplasm of large intestine: Secondary | ICD-10-CM

## 2019-06-01 MED ORDER — SODIUM CHLORIDE 0.9 % IV SOLN: 500.0000 mL | Freq: Once | INTRAVENOUS | Status: DC

## 2019-06-01 NOTE — Op Note (Signed)
[facility] Patient Name: Ryan Hernandez Procedure Date: 06/01/2019 8:55 AM MRN: [id number] Endoscopist: Docia Chuck. Henrene Pastor , MD Age: 74 Referring MD:  Date of Birth: 07-28-1945 Gender: Male Account #: [id number] Procedure:                Colonoscopy with cold snare polypectomy x 5 Indications:              High risk colon cancer surveillance: Personal                            history of multiple (3 or more) adenomas, High risk                            colon cancer surveillance: Personal history of                            colon cancer 2003 status post right hemicolectomy.                            Surveillance examinations 2004, 2005, 2007, 2010,                            2018 Medicines:                Monitored Anesthesia Care Procedure:                Pre-Anesthesia Assessment:                           - Prior to the procedure, a History and Physical                            was performed, and patient medications and                            allergies were reviewed. The patient's tolerance of                            previous anesthesia was also reviewed. The risks                            and benefits of the procedure and the sedation                            options and risks were discussed with the patient.                            All questions were answered, and informed consent                            was obtained. Prior Anticoagulants: The patient has                            taken no previous anticoagulant or antiplatelet  agents. ASA Grade Assessment: II - A patient with                            mild systemic disease. After reviewing the risks                            and benefits, the patient was deemed in                            satisfactory condition to undergo the procedure.                           After obtaining informed consent, the colonoscope                            was passed under direct vision.  Throughout the                            procedure, the patient's blood pressure, pulse, and                            oxygen saturations were monitored continuously. The                            Colonoscope was introduced through the anus and                            advanced to the the ileocolonic anastomosis. The                            terminal ileum and the rectum were photographed.                            The quality of the bowel preparation was excellent.                            The colonoscopy was performed without difficulty.                            The patient tolerated the procedure well. The bowel                            preparation used was SUPREP via split dose                            instruction. Scope In: 9:03:55 AM Scope Out: 9:19:30 AM Scope Withdrawal Time: 0 hours 14 minutes 6 seconds  Total Procedure Duration: 0 hours 15 minutes 35 seconds  Findings:                 Five polyps were found in the descending colon and                            transverse colon. The polyps were 3 to 6  mm in                            size. These polyps were removed with a cold snare.                            Resection and retrieval were complete.                           Multiple diverticula were found in the left colon.                           Internal hemorrhoids were found during retroflexion.                           The exam was otherwise without abnormality on                            direct and retroflexion views, status post right                            hemicolectomy. Complications:            No immediate complications. Estimated blood loss:                            None. Estimated Blood Loss:     Estimated blood loss: none. Impression:               - Five 3 to 6 mm polyps in the descending colon and                            in the transverse colon, removed with a cold snare.                            Resected and retrieved.                            - Diverticulosis in the left colon.                           - Internal hemorrhoids.                           - Status post right hemicolectomy                           - The examination was otherwise normal on direct                            and retroflexion views. Recommendation:           - Repeat colonoscopy in 3 years for surveillance.                           - Patient has a contact number available for  emergencies. The signs and symptoms of potential                            delayed complications were discussed with the                            patient. Return to normal activities tomorrow.                            Written discharge instructions were provided to the                            patient.                           - Resume previous diet.                           - Continue present medications.                           - Await pathology results. Docia Chuck. Henrene Pastor, MD 06/01/2019 9:28:24 AM This report has been signed electronically.

## 2019-06-01 NOTE — Progress Notes (Signed)
PT taken to PACU. Monitors in place. VSS. Report given to RN. 

## 2019-06-01 NOTE — Progress Notes (Signed)
Pt's states no medical or surgical changes since previsit or office visit. 

## 2019-06-01 NOTE — Patient Instructions (Signed)
Handouts given for polyps, diverticulosis and hemorrhoids.  YOU HAD AN ENDOSCOPIC PROCEDURE TODAY AT THE [facility]:   Refer to the procedure report that was given to you for any specific questions about what was found during the examination.  If the procedure report does not answer your questions, please call your gastroenterologist to clarify.  If you requested that your care partner not be given the details of your procedure findings, then the procedure report has been included in a sealed envelope for you to review at your convenience later.  YOU SHOULD EXPECT: Some feelings of bloating in the abdomen. Passage of more gas than usual.  Walking can help get rid of the air that was put into your GI tract during the procedure and reduce the bloating. If you had a lower endoscopy (such as a colonoscopy or flexible sigmoidoscopy) you may notice spotting of blood in your stool or on the toilet paper. If you underwent a bowel prep for your procedure, you may not have a normal bowel movement for a few days.  Please Note:  You might notice some irritation and congestion in your nose or some drainage.  This is from the oxygen used during your procedure.  There is no need for concern and it should clear up in a day or so.  SYMPTOMS TO REPORT IMMEDIATELY:   Following lower endoscopy (colonoscopy or flexible sigmoidoscopy):  Excessive amounts of blood in the stool  Significant tenderness or worsening of abdominal pains  Swelling of the abdomen that is new, acute  Fever of 100F or higher  For urgent or emergent issues, a gastroenterologist can be reached at any hour by calling [phone]. Do not use MyChart messaging for urgent concerns.    DIET:  We do recommend a small meal at first, but then you may proceed to your regular diet.  Drink plenty of fluids but you should avoid alcoholic beverages for 24 hours.  ACTIVITY:  You should plan to take it easy for the rest of today and you  should NOT DRIVE or use heavy machinery until tomorrow (because of the sedation medicines used during the test).    FOLLOW UP: Our staff will call the number listed on your records 48-72 hours following your procedure to check on you and address any questions or concerns that you may have regarding the information given to you following your procedure. If we do not reach you, we will leave a message.  We will attempt to reach you two times.  During this call, we will ask if you have developed any symptoms of COVID 19. If you develop any symptoms (ie: fever, flu-like symptoms, shortness of breath, cough etc.) before then, please call [phone].  If you test positive for Covid 19 in the 2 weeks post procedure, please call and report this information to us.    If any biopsies were taken you will be contacted by phone or by letter within the next 1-3 weeks.  Please call us at [phone] if you have not heard about the biopsies in 3 weeks.    SIGNATURES/CONFIDENTIALITY: You and/or your care partner have signed paperwork which will be entered into your electronic medical record.  These signatures attest to the fact that that the information above on your After Visit Summary has been reviewed and is understood.  Full responsibility of the confidentiality of this discharge information lies with you and/or your care-partner. 

## 2019-06-03 ENCOUNTER — Telehealth: Payer: Self-pay

## 2019-06-03 ENCOUNTER — Encounter: Payer: Self-pay | Admitting: Internal Medicine

## 2019-06-03 NOTE — Telephone Encounter (Signed)
  Follow up Call-  Call back number 06/01/2019  Post procedure Call Back phone  # [phone]  Permission to leave phone message Yes  Some recent data might be hidden     Patient questions:  Do you have a fever, pain , or abdominal swelling? No. Pain Score  0 *  Have you tolerated food without any problems? Yes.    Have you been able to return to your normal activities? Yes.    Do you have any questions about your discharge instructions: Diet   No. Medications  No. Follow up visit  No.  Do you have questions or concerns about your Care? No.  Actions: * If pain score is 4 or above: No action needed, pain <4.  1. Have you developed a fever since your procedure? no  2.   Have you had an respiratory symptoms (SOB or cough) since your procedure? no  3.   Have you tested positive for COVID 19 since your procedure no  4.   Have you had any family members/close contacts diagnosed with the COVID 19 since your procedure?  no   If yes to any of these questions please route to Joylene John, RN and Alphonsa Gin, Therapist, sports.

## 2019-06-29 DIAGNOSIS — H35371 Puckering of macula, right eye: Secondary | ICD-10-CM | POA: Diagnosis not present

## 2019-06-29 DIAGNOSIS — H43393 Other vitreous opacities, bilateral: Secondary | ICD-10-CM | POA: Diagnosis not present

## 2019-06-29 DIAGNOSIS — E119 Type 2 diabetes mellitus without complications: Secondary | ICD-10-CM | POA: Diagnosis not present

## 2019-06-29 DIAGNOSIS — H2513 Age-related nuclear cataract, bilateral: Secondary | ICD-10-CM | POA: Diagnosis not present

## 2019-11-10 DIAGNOSIS — H33002 Unspecified retinal detachment with retinal break, left eye: Secondary | ICD-10-CM | POA: Insufficient documentation

## 2019-12-21 DIAGNOSIS — Z23 Encounter for immunization: Secondary | ICD-10-CM | POA: Diagnosis not present

## 2020-02-06 DIAGNOSIS — Z23 Encounter for immunization: Secondary | ICD-10-CM | POA: Diagnosis not present

## 2021-11-21 NOTE — H&P (Signed)
Patient's anticipated LOS is less than 2 midnights, meeting these requirements: - Younger than 83 - Lives within 1 hour of care - Has a competent adult at home to recover with post-op recover - NO history of  - Chronic pain requiring opiods  - Diabetes  - Coronary Artery Disease  - Heart failure  - Heart attack  - Stroke  - DVT/VTE  - Cardiac arrhythmia  - Respiratory Failure/COPD  - Renal failure  - Anemia  - Advanced Liver disease     Ryan Hernandez is an 76 y.o. male.    Chief Complaint: right knee pain  HPI: Pt is a 76 y.o. male complaining of right knee pain for multiple years. Pain had continually increased since the beginning. X-rays in the clinic show end-stage arthritic changes of the right knee. Pt has tried various conservative treatments which have failed to alleviate their symptoms, including injections and therapy. Various options are discussed with the patient. Risks, benefits and expectations were discussed with the patient. Patient understand the risks, benefits and expectations and wishes to proceed with surgery.   PCP:  Gerome Sam, MD  D/C Plans: Home  PMH: Past Medical History:  Diagnosis Date   Allergy    Anxiety    Arthritis    Blood transfusion 2002   Cancer [facility]) 2002   colon   Depression    Diabetes mellitus without complication ([place])    Fatty tumor    under arms, back,legs, chest   GERD (gastroesophageal reflux disease)    hx of, no medications currently   Heart aneurysm    Aortic aneurysm/   Hyperlipidemia    Hypertension    Sleep apnea    nightly c-pap    PSH: Past Surgical History:  Procedure Laterality Date   APPENDECTOMY     COLONOSCOPY     CYST EXCISION  2019   spinal cord   EXCISIONAL HEMORRHOIDECTOMY  1975   EXCISIONAL TOTAL KNEE ARTHROPLASTY  01/09/2016   left knee   fatty tumors     right arm/ per pt all over body   PARTIAL COLECTOMY  2002   colectomy   TONSILLECTOMY      Social History:  reports  that he quit smoking about 38 years ago. He has never used smokeless tobacco. He reports that he does not drink alcohol and does not use drugs. BMI: Estimated body mass index is 33.29 kg/m as calculated from the following:   Height as of 06/01/19: '5\' 10"'$  (1.778 m).   Weight as of 06/01/19: 105.2 kg.  Lab Results  Component Value Date   ALBUMIN 4.6 06/08/2015   Diabetes: Patient does not have a diagnosis of diabetes.     Smoking Status:   reports that he quit smoking about 38 years ago. He has never used smokeless tobacco.    Allergies:  Allergies  Allergen Reactions   Ace Inhibitors Cough    Medications: Current Facility-Administered Medications  Medication Dose Route Frequency Provider Last Rate Last Admin   0.9 %  sodium chloride infusion  500 mL Intravenous Continuous Irene Shipper, MD       Current Outpatient Medications  Medication Sig Dispense Refill   ALPRAZolam (XANAX) 0.5 MG tablet TAKE ONE TABLET BY MOUTH TWICE DAILY AS NEEDED 60 tablet 0   amLODipine (NORVASC) 10 MG tablet Take 1 tablet (10 mg total) by mouth daily. 90 tablet 1   aspirin 81 MG tablet Take 81 mg by mouth at bedtime.  atorvastatin (LIPITOR) 40 MG tablet Take 1 tablet (40 mg total) by mouth at bedtime. 90 tablet 1   Cholecalciferol 10000 UNITS CAPS Take 1,000 Units by mouth.     diphenhydramine-acetaminophen (TYLENOL PM) 25-500 MG TABS tablet Take 1 tablet by mouth at bedtime as needed.     finasteride (PROSCAR) 5 MG tablet Take 5 mg by mouth daily.     hydrochlorothiazide (HYDRODIURIL) 25 MG tablet Take 25 mg by mouth daily.     losartan (COZAAR) 100 MG tablet Take 100 mg by mouth daily.     losartan-hydrochlorothiazide (HYZAAR) 100-25 MG tablet Take 1 tablet by mouth daily. 90 tablet 1   meclizine (ANTIVERT) 25 MG tablet TAKE ONE-HALF TO ONE TABLET BY MOUTH THREE TIMES DAILY AS NEEDED FOR DIZZINESS (Patient not taking: Reported on 04/09/2016) 30 tablet 4   Melatonin 10 MG CAPS Take 10 mg by mouth  daily as needed.     meloxicam (MOBIC) 7.5 MG tablet Take 1 tablet (7.5 mg total) by mouth daily. (Patient taking differently: Take 7.5 mg by mouth as needed. ) 60 tablet 1   metFORMIN (GLUCOPHAGE) 500 MG tablet Take by mouth daily with breakfast.     nitroGLYCERIN (NITROSTAT) 0.4 MG SL tablet Place 1 tablet (0.4 mg total) under the tongue every 5 (five) minutes as needed. (Patient not taking: Reported on 05/18/2019) 30 tablet 11   polyethylene glycol (MIRALAX / GLYCOLAX) packet Take 17 g by mouth as needed.      Psyllium (METAMUCIL PO) Take by mouth.     simvastatin (ZOCOR) 20 MG tablet 40 mg Daily.      tamsulosin (FLOMAX) 0.4 MG CAPS capsule TAKE ONE CAPSULE BY MOUTH EVERY DAY AFTER  SUPPER (Patient taking differently: TAKE TWO CAPSULE BY MOUTH AT BEDTIME) 30 capsule 4   triamcinolone cream (KENALOG) 0.1 % Apply 1 application topically 2 (two) times daily. No longer than 2 weeks in one location. (Patient taking differently: Apply 1 application topically as needed. No longer than 2 weeks in one location.) 30 g 0   zolpidem (AMBIEN) 10 MG tablet TAKE ONE TABLET BY MOUTH AT BEDTIME AS NEEDED FOR SLEEP 30 tablet 0    No results found for this or any previous visit (from the past 48 hour(s)). No results found.  ROS: Pain with rom of the right lower extremity  Physical Exam: Alert and oriented 76 y.o. male in no acute distress Cranial nerves 2-12 intact Cervical spine: full rom with no tenderness, nv intact distally Chest: active breath sounds bilaterally, no wheeze rhonchi or rales Heart: regular rate and rhythm, no murmur Abd: non tender non distended with active bowel sounds Hip is stable with rom  Right knee painful rom with crepitus Nv intact distally Antalgic gait  Assessment/Plan Assessment: right knee end stage osteoarthritis  Plan:  Patient will undergo a right total knee by Dr. Veverly Fells at [facility] Risks benefits and expectations were discussed with the patient. Patient  understand risks, benefits and expectations and wishes to proceed. Preoperative templating of the joint replacement has been completed, documented, and submitted to the Operating Room personnel in order to optimize intra-operative equipment management.   Merla Riches PA-C, MPAS [facility] is now [employer] [address], [place], [place], [place] [address] Phone: [phone] [email or web address] Facebook  [employer]

## 2021-12-08 NOTE — Progress Notes (Signed)
COVID Vaccine Completed:  Date of COVID positive in last 90 days:  PCP - Gerome Sam, MD Cardiologist -   Chest x-ray -  EKG -  Stress Test -  ECHO -  Cardiac Cath -  Pacemaker/ICD device last checked: Spinal Cord Stimulator:  Bowel Prep -   Sleep Study -  Yes, +sleep apnea CPAP -   Fasting Blood Sugar -  Checks Blood Sugar _____ times a day  Blood Thinner Instructions: Aspirin Instructions: Last Dose:  Activity level:  Can go up a flight of stairs and perform activities of daily living without stopping and without symptoms of chest pain or shortness of breath.  Able to exercise without symptoms  Unable to go up a flight of stairs without symptoms of     Anesthesia review:  CAD, HTN, OSA, DM  Patient denies shortness of breath, fever, cough and chest pain at PAT appointment  Patient verbalized understanding of instructions that were given to them at the PAT appointment. Patient was also instructed that they will need to review over the PAT instructions again at home before surgery.

## 2021-12-08 NOTE — Patient Instructions (Addendum)
SURGICAL WAITING ROOM VISITATION Patients having surgery or a procedure may have no more than 2 support people in the waiting area - these visitors may rotate.   Children under the age of 67 must have an adult with them who is not the patient. If the patient needs to stay at the hospital during part of their recovery, the visitor guidelines for inpatient rooms apply. Pre-op nurse will coordinate an appropriate time for 1 support person to accompany patient in pre-op.  This support person may not rotate.    Please refer to the [facility] website for the visitor guidelines for Inpatients (after your surgery is over and you are in a regular room).      Your procedure is scheduled on: 12-15-21   Report to [facility] Main Entrance    Report to admitting at 8:30 AM   Call this number if you have problems the morning of surgery [phone]   Do not eat food :After Midnight.   After Midnight you may have the following liquids until 8:00 AM DAY OF SURGERY  Water Non-Citrus Juices (without pulp, NO RED) Carbonated Beverages Black Coffee (NO MILK/CREAM OR CREAMERS, sugar ok)  Clear Tea (NO MILK/CREAM OR CREAMERS, sugar ok) regular and decaf                             Plain Jell-O (NO RED)                                           Fruit ices (not with fruit pulp, NO RED)                                     Popsicles (NO RED)                                                               Sports drinks like Gatorade (NO RED)                   The day of surgery:  Drink ONE (1) Pre-Surgery G2 at 8:00 AM the morning of surgery. Drink in one sitting. Do not sip.  This drink was given to you during your hospital  pre-op appointment visit. Nothing else to drink after completing the Pre-Surgery G2.          If you have questions, please contact your surgeon's office.   FOLLOW  ANY ADDITIONAL PRE OP INSTRUCTIONS YOU RECEIVED FROM YOUR SURGEON'S OFFICE!!!     Oral Hygiene is also  important to reduce your risk of infection.                                    Remember - BRUSH YOUR TEETH THE MORNING OF SURGERY WITH YOUR REGULAR TOOTHPASTE   Do NOT smoke after Midnight   Take these medicines the morning of surgery with A SIP OF WATER:  Alprazolam, Amlodipine, Finasteride, Simvastatin  How to Manage Your Diabetes Before and After  Surgery  Why is it important to control my blood sugar before and after surgery? Improving blood sugar levels before and after surgery helps healing and can limit problems. A way of improving blood sugar control is eating a healthy diet by:  Eating less sugar and carbohydrates  Increasing activity/exercise  Talking with your doctor about reaching your blood sugar goals High blood sugars (greater than 180 mg/dL) can raise your risk of infections and slow your recovery, so you will need to focus on controlling your diabetes during the weeks before surgery. Make sure that the doctor who takes care of your diabetes knows about your planned surgery including the date and location.  How do I manage my blood sugar before surgery? Check your blood sugar at least 4 times a day, starting 2 days before surgery, to make sure that the level is not too high or low. Check your blood sugar the morning of your surgery when you wake up and every 2 hours until you get to the Short Stay unit. If your blood sugar is less than 70 mg/dL, you will need to treat for low blood sugar: Do not take insulin. Treat a low blood sugar (less than 70 mg/dL) with  cup of clear juice (cranberry or apple), 4 glucose tablets, OR glucose gel. Recheck blood sugar in 15 minutes after treatment (to make sure it is greater than 70 mg/dL). If your blood sugar is not greater than 70 mg/dL on recheck, call [phone] for further instructions. Report your blood sugar to the short stay nurse when you get to Short Stay.  If you are admitted to the hospital after surgery: Your blood sugar  will be checked by the staff and you will probably be given insulin after surgery (instead of oral diabetes medicines) to make sure you have good blood sugar levels. The goal for blood sugar control after surgery is 80-180 mg/dL.   WHAT DO I DO ABOUT MY DIABETES MEDICATION?  Do not take oral diabetes medicines (pills) the morning of surgery.  THE MORNING OF SURGERY:  Do not take Metformin   Reviewed and Endorsed by [facility] Patient Education Committee, August 2015   Bring CPAP mask and tubing day of surgery.                              You may not have any metal on your body including  jewelry, and body piercing             Do not wear  lotions, powders, cologne, or deodorant              Men may shave face and neck.   Do not bring valuables to the hospital. [place].   Contacts, dentures or bridgework may not be worn into surgery.   Bring small overnight bag day of surgery.   DO NOT [place]. PHARMACY WILL DISPENSE MEDICATIONS LISTED ON YOUR MEDICATION LIST TO YOU DURING YOUR ADMISSION [place]!    Special Instructions: Bring a copy of your healthcare power of attorney and living will documents the day of surgery if you haven't scanned them before.  Please read over the following fact sheets you were given: IF YOU HAVE QUESTIONS ABOUT YOUR PRE-OP INSTRUCTIONS PLEASE CALL [place] - Preparing for Surgery Before surgery, you can play an important role.  Because  skin is not sterile, your skin needs to be as free of germs as possible.  You can reduce the number of germs on your skin by washing with CHG (chlorahexidine gluconate) soap before surgery.  CHG is an antiseptic cleaner which kills germs and bonds with the skin to continue killing germs even after washing. Please DO NOT use if you have an allergy to CHG or antibacterial soaps.  If your skin becomes reddened/irritated  stop using the CHG and inform your nurse when you arrive at Short Stay. Do not shave (including legs and underarms) for at least 48 hours prior to the first CHG shower.  You may shave your face/neck.  Please follow these instructions carefully:  1.  Shower with CHG Soap the night before surgery and the  morning of surgery.  2.  If you choose to wash your hair, wash your hair first as usual with your normal  shampoo.  3.  After you shampoo, rinse your hair and body thoroughly to remove the shampoo.                             4.  Use CHG as you would any other liquid soap.  You can apply chg directly to the skin and wash.  Gently with a scrungie or clean washcloth.  5.  Apply the CHG Soap to your body ONLY FROM THE NECK DOWN.   Do   not use on face/ open                           Wound or open sores. Avoid contact with eyes, ears mouth and   genitals (private parts).                       Wash face,  Genitals (private parts) with your normal soap.             6.  Wash thoroughly, paying special attention to the area where your    surgery  will be performed.  7.  Thoroughly rinse your body with warm water from the neck down.  8.  DO NOT shower/wash with your normal soap after using and rinsing off the CHG Soap.                9.  Pat yourself dry with a clean towel.            10.  Wear clean pajamas.            11.  Place clean sheets on your bed the night of your first shower and do not  sleep with pets. Day of Surgery : Do not apply any lotions/deodorants the morning of surgery.  Please wear clean clothes to the hospital/surgery center.  FAILURE TO FOLLOW THESE INSTRUCTIONS MAY RESULT IN THE CANCELLATION OF YOUR SURGERY  PATIENT SIGNATURE_________________________________  NURSE SIGNATURE__________________________________  ________________________________________________________________________     Ryan Hernandez  An incentive spirometer is a tool that can help keep your lungs  clear and active. This tool measures how well you are filling your lungs with each breath. Taking long deep breaths may help reverse or decrease the chance of developing breathing (pulmonary) problems (especially infection) following: A long period of time when you are unable to move or be active. BEFORE THE PROCEDURE  If the spirometer includes an indicator to show your best effort, your nurse or respiratory  therapist will set it to a desired goal. If possible, sit up straight or lean slightly forward. Try not to slouch. Hold the incentive spirometer in an upright position. INSTRUCTIONS FOR USE  Sit on the edge of your bed if possible, or sit up as far as you can in bed or on a chair. Hold the incentive spirometer in an upright position. Breathe out normally. Place the mouthpiece in your mouth and seal your lips tightly around it. Breathe in slowly and as deeply as possible, raising the piston or the ball toward the top of the column. Hold your breath for 3-5 seconds or for as long as possible. Allow the piston or ball to fall to the bottom of the column. Remove the mouthpiece from your mouth and breathe out normally. Rest for a few seconds and repeat Steps 1 through 7 at least 10 times every 1-2 hours when you are awake. Take your time and take a few normal breaths between deep breaths. The spirometer may include an indicator to show your best effort. Use the indicator as a goal to work toward during each repetition. After each set of 10 deep breaths, practice coughing to be sure your lungs are clear. If you have an incision (the cut made at the time of surgery), support your incision when coughing by placing a pillow or rolled up towels firmly against it. Once you are able to get out of bed, walk around indoors and cough well. You may stop using the incentive spirometer when instructed by your caregiver.  RISKS AND COMPLICATIONS Take your time so you do not get dizzy or light-headed. If you  are in pain, you may need to take or ask for pain medication before doing incentive spirometry. It is harder to take a deep breath if you are having pain. AFTER USE Rest and breathe slowly and easily. It can be helpful to keep track of a log of your progress. Your caregiver can provide you with a simple table to help with this. If you are using the spirometer at home, follow these instructions: [place] IF:  You are having difficultly using the spirometer. You have trouble using the spirometer as often as instructed. Your pain medication is not giving enough relief while using the spirometer. You develop fever of 100.5 F (38.1 C) or higher. SEEK IMMEDIATE MEDICAL CARE IF:  You cough up bloody sputum that had not been present before. You develop fever of 102 F (38.9 C) or greater. You develop worsening pain at or near the incision site. MAKE SURE YOU:  Understand these instructions. Will watch your condition. Will get help right away if you are not doing well or get worse. Document Released: 07/23/2006 Document Revised: 06/04/2011 Document Reviewed: 09/23/2006 [facility] Patient Information 2014 [place], [place].   ________________________________________________________________________

## 2021-12-13 ENCOUNTER — Encounter (HOSPITAL_COMMUNITY)
Admission: RE | Admit: 2021-12-13 | Discharge: 2021-12-13 | Disposition: A | Discharge status: Home / Self Care | Payer: No Typology Code available for payment source | Source: Ambulatory Visit | Attending: Orthopedic Surgery | Admitting: Orthopedic Surgery

## 2021-12-13 ENCOUNTER — Encounter (HOSPITAL_COMMUNITY): Payer: Self-pay

## 2021-12-13 ENCOUNTER — Other Ambulatory Visit: Payer: Self-pay

## 2021-12-13 VITALS — BP 129/71 | HR 89 | Temp 98.1°F | Resp 20 | Ht 70.0 in | Wt 223.4 lb

## 2021-12-13 DIAGNOSIS — I10 Essential (primary) hypertension: Secondary | ICD-10-CM | POA: Insufficient documentation

## 2021-12-13 DIAGNOSIS — I719 Aortic aneurysm of unspecified site, without rupture: Secondary | ICD-10-CM | POA: Insufficient documentation

## 2021-12-13 DIAGNOSIS — M1711 Unilateral primary osteoarthritis, right knee: Secondary | ICD-10-CM | POA: Insufficient documentation

## 2021-12-13 DIAGNOSIS — G4730 Sleep apnea, unspecified: Secondary | ICD-10-CM | POA: Diagnosis not present

## 2021-12-13 DIAGNOSIS — Z87891 Personal history of nicotine dependence: Secondary | ICD-10-CM | POA: Diagnosis not present

## 2021-12-13 DIAGNOSIS — E119 Type 2 diabetes mellitus without complications: Secondary | ICD-10-CM | POA: Diagnosis not present

## 2021-12-13 DIAGNOSIS — I2510 Atherosclerotic heart disease of native coronary artery without angina pectoris: Secondary | ICD-10-CM | POA: Insufficient documentation

## 2021-12-13 DIAGNOSIS — Z01818 Encounter for other preprocedural examination: Secondary | ICD-10-CM | POA: Diagnosis not present

## 2021-12-13 HISTORY — DX: Post-traumatic stress disorder, unspecified: F43.10

## 2021-12-13 HISTORY — DX: Anemia, unspecified: D64.9

## 2021-12-13 HISTORY — DX: Atherosclerotic heart disease of native coronary artery without angina pectoris: I25.10

## 2021-12-13 HISTORY — DX: Pneumonia, unspecified organism: J18.9

## 2021-12-13 LAB — BASIC METABOLIC PANEL
Anion gap: 9 (ref 5–15)
BUN: 21 mg/dL (ref 8–23)
CO2: 24 mmol/L (ref 22–32)
Calcium: 9.8 mg/dL (ref 8.9–10.3)
Chloride: 108 mmol/L (ref 98–111)
Creatinine, Ser: 1.04 mg/dL (ref 0.61–1.24)
GFR, Estimated: >60 mL/min (ref >60)
Glucose, Bld: 200 mg/dL — ABNORMAL HIGH (ref 70–99)
Potassium: 3.4 mmol/L — ABNORMAL LOW (ref 3.5–5.1)
Sodium: 141 mmol/L (ref 135–145)

## 2021-12-13 LAB — CBC
HCT: 47.7 % (ref 39.0–52.0)
Hemoglobin: 16.5 g/dL (ref 13.0–17.0)
MCH: 32.7 pg (ref 26.0–34.0)
MCHC: 34.6 g/dL (ref 30.0–36.0)
MCV: 94.5 fL (ref 80.0–100.0)
Platelets: 261 10*3/uL (ref 150–400)
RBC: 5.05 MIL/uL (ref 4.22–5.81)
RDW: 13.3 % (ref 11.5–15.5)
WBC: 6.6 10*3/uL (ref 4.0–10.5)
nRBC: 0 % (ref 0.0–0.2)

## 2021-12-13 LAB — GLUCOSE, CAPILLARY: Glucose-Capillary: 223 mg/dL — ABNORMAL HIGH (ref 70–99)

## 2021-12-13 LAB — SURGICAL PCR SCREEN
MRSA, PCR: NEGATIVE
Staphylococcus aureus: POSITIVE — AB

## 2021-12-13 LAB — HEMOGLOBIN A1C
Hgb A1c MFr Bld: 6.9 % — ABNORMAL HIGH (ref 4.8–5.6)
Mean Plasma Glucose: 151.33 mg/dL

## 2021-12-14 ENCOUNTER — Encounter (HOSPITAL_COMMUNITY): Payer: Self-pay | Admitting: Physician Assistant

## 2021-12-14 NOTE — Progress Notes (Signed)
PCR results sent to Dr. Norris to review. 

## 2021-12-14 NOTE — Progress Notes (Signed)
Anesthesia Chart Review   Case: [id number] Date/Time: 12/15/21 1045   Procedure: TOTAL KNEE ARTHROPLASTY (Right: Knee)   Anesthesia type: Spinal   Pre-op diagnosis: Right knee end stage osteoarthrits   Location: [facility] ROOM 06 / [facility]   Surgeons: Netta Cedars, MD       DISCUSSION:76 y.o. former smoker with h/o HTN, sleep apnea with cpap, CAD, DM II, aortic aneurysm, right knee OA scheduled for above procedure 12/15/2021 with Dr. Netta Cedars.   Cardiac clearance requested.  VS: BP 129/71   Pulse 89   Temp 36.7 C (Oral)   Resp 20   Ht '5\' 10"'$  (1.778 m)   Wt 101.3 kg   SpO2 98%   BMI 32.05 kg/m   PROVIDERS: Gerome Sam, MD is PCP    LABS: Labs reviewed: Acceptable for surgery. (all labs ordered are listed, but only abnormal results are displayed)  Labs Reviewed  SURGICAL PCR SCREEN - Abnormal; Notable for the following components:      Result Value   Staphylococcus aureus POSITIVE (*)    All other components within normal limits  HEMOGLOBIN A1C - Abnormal; Notable for the following components:   Hgb A1c MFr Bld 6.9 (*)    All other components within normal limits  BASIC METABOLIC PANEL - Abnormal; Notable for the following components:   Potassium 3.4 (*)    Glucose, Bld 200 (*)    All other components within normal limits  GLUCOSE, CAPILLARY - Abnormal; Notable for the following components:   Glucose-Capillary 223 (*)    All other components within normal limits  CBC     IMAGES:   EKG:   CV:  Past Medical History:  Diagnosis Date   Allergy    Anemia    Anxiety    Arthritis    Blood transfusion 2002   Cancer [facility]) 2002   colon   Coronary artery disease    Depression    Diabetes mellitus without complication (HCC)    Fatty tumor    under arms, back,legs, chest   GERD (gastroesophageal reflux disease)    hx of, no medications currently   Heart aneurysm    Aortic aneurysm/   Hyperlipidemia    Hypertension    Pneumonia    PTSD  (post-traumatic stress disorder)    Sleep apnea    nightly c-pap    Past Surgical History:  Procedure Laterality Date   APPENDECTOMY     CARDIAC CATHETERIZATION     COLONOSCOPY     CYST EXCISION  2019   spinal cord   EXCISIONAL HEMORRHOIDECTOMY  1975   EXCISIONAL TOTAL KNEE ARTHROPLASTY  01/09/2016   left knee   fatty tumors     right arm/ per pt all over body   PARTIAL COLECTOMY  2002   colectomy   TONSILLECTOMY      MEDICATIONS:  ALPRAZolam (XANAX) 0.5 MG tablet   amLODipine (NORVASC) 10 MG tablet   aspirin EC 81 MG tablet   atorvastatin (LIPITOR) 40 MG tablet   Cholecalciferol 10000 UNITS CAPS   empagliflozin (JARDIANCE) 10 MG TABS tablet   finasteride (PROSCAR) 5 MG tablet   hydrochlorothiazide (HYDRODIURIL) 25 MG tablet   Ibuprofen-diphenhydrAMINE Cit (ADVIL PM) 200-38 MG TABS   losartan (COZAAR) 100 MG tablet   losartan-hydrochlorothiazide (HYZAAR) 100-25 MG tablet   magnesium gluconate (MAGONATE) 500 MG tablet   meclizine (ANTIVERT) 25 MG tablet   Melatonin 10 MG CAPS   meloxicam (MOBIC) 7.5 MG tablet   metFORMIN (GLUCOPHAGE) 500  MG tablet   mirtazapine (REMERON) 30 MG tablet   nitroGLYCERIN (NITROSTAT) 0.4 MG SL tablet   OVER THE COUNTER MEDICATION   potassium chloride (KLOR-CON) 10 MEQ tablet   tamsulosin (FLOMAX) 0.4 MG CAPS capsule   traZODone (DESYREL) 100 MG tablet   triamcinolone cream (KENALOG) 0.1 %   zolpidem (AMBIEN) 10 MG tablet    0.9 %  sodium chloride infusion   Esti Demello Ward, PA-C [facility] Pre-Surgical Testing [phone]

## 2021-12-15 ENCOUNTER — Encounter (HOSPITAL_COMMUNITY): Admission: RE | Payer: Self-pay | Source: Ambulatory Visit

## 2021-12-15 ENCOUNTER — Ambulatory Visit (HOSPITAL_COMMUNITY)
Admission: RE | Admit: 2021-12-15 | Payer: No Typology Code available for payment source | Source: Ambulatory Visit | Admitting: Orthopedic Surgery

## 2021-12-15 SURGERY — ARTHROPLASTY, KNEE, TOTAL
Anesthesia: Spinal | Site: Knee | Laterality: Right

## 2022-02-20 NOTE — Progress Notes (Signed)
Sent message, via epic in basket, requesting orders in epic from surgeon.  

## 2022-02-20 NOTE — H&P (Signed)
Patient's anticipated LOS is less than 2 midnights, meeting these requirements: - Younger than 64 - Lives within 1 hour of care - Has a competent adult at home to recover with post-op recover - NO history of  - Chronic pain requiring opiods  - Diabetes  - Coronary Artery Disease  - Heart failure  - Heart attack  - Stroke  - DVT/VTE  - Cardiac arrhythmia  - Respiratory Failure/COPD  - Renal failure  - Anemia  - Advanced Liver disease     Ryan Hernandez is an 76 y.o. male.    Chief Complaint: right knee pain  HPI: Pt is a 76 y.o. male complaining of right knee pain pain for multiple years. Pain had continually increased since the beginning. X-rays in the clinic show end-stage arthritic changes of the right knee. Pt has tried various conservative treatments which have failed to alleviate their symptoms, including injections and therapy. Various options are discussed with the patient. Risks, benefits and expectations were discussed with the patient. Patient understand the risks, benefits and expectations and wishes to proceed with surgery.   PCP:  Gerome Sam, MD  D/C Plans: Home  PMH: Past Medical History:  Diagnosis Date   Allergy    Anemia    Anxiety    Arthritis    Blood transfusion 2002   Cancer [facility]) 2002   colon   Coronary artery disease    Depression    Diabetes mellitus without complication ([place])    Fatty tumor    under arms, back,legs, chest   GERD (gastroesophageal reflux disease)    hx of, no medications currently   Heart aneurysm    Aortic aneurysm/   Hyperlipidemia    Hypertension    Pneumonia    PTSD (post-traumatic stress disorder)    Sleep apnea    nightly c-pap    PSH: Past Surgical History:  Procedure Laterality Date   APPENDECTOMY     CARDIAC CATHETERIZATION     COLONOSCOPY     CYST EXCISION  2019   spinal cord   EXCISIONAL HEMORRHOIDECTOMY  1975   EXCISIONAL TOTAL KNEE ARTHROPLASTY  01/09/2016   left knee   fatty tumors      right arm/ per pt all over body   PARTIAL COLECTOMY  2002   colectomy   TONSILLECTOMY      Social History:  reports that he quit smoking about 39 years ago. His smoking use included cigarettes. He has never used smokeless tobacco. He reports that he does not drink alcohol and does not use drugs. BMI: Estimated body mass index is 32.05 kg/m as calculated from the following:   Height as of 12/13/21: '5\' 10"'$  (1.778 m).   Weight as of 12/13/21: 101.3 kg.  Lab Results  Component Value Date   ALBUMIN 4.6 06/08/2015   Diabetes: Patient does not have a diagnosis of diabetes. Lab Results  Component Value Date   HGBA1C 6.9 (H) 12/13/2021     Smoking Status:      Allergies:  Allergies  Allergen Reactions   Ace Inhibitors Cough    Medications: Current Facility-Administered Medications  Medication Dose Route Frequency Provider Last Rate Last Admin   0.9 %  sodium chloride infusion  500 mL Intravenous Continuous Irene Shipper, MD       Current Outpatient Medications  Medication Sig Dispense Refill   ALPRAZolam (XANAX) 0.5 MG tablet TAKE ONE TABLET BY MOUTH TWICE DAILY AS NEEDED (Patient not taking: Reported on 12/08/2021) 60 tablet 0  amLODipine (NORVASC) 10 MG tablet Take 1 tablet (10 mg total) by mouth daily. 90 tablet 1   aspirin EC 81 MG tablet Take 81 mg by mouth daily.     atorvastatin (LIPITOR) 40 MG tablet Take 1 tablet (40 mg total) by mouth at bedtime. 90 tablet 1   Cholecalciferol 10000 UNITS CAPS Take 1,000 Units by mouth at bedtime.     empagliflozin (JARDIANCE) 10 MG TABS tablet Take 10 mg by mouth daily.     finasteride (PROSCAR) 5 MG tablet Take 5 mg by mouth at bedtime.     hydrochlorothiazide (HYDRODIURIL) 25 MG tablet Take 25 mg by mouth daily.     Ibuprofen-diphenhydrAMINE Cit (ADVIL PM) 200-38 MG TABS Take 3 tablets by mouth at bedtime.     losartan (COZAAR) 100 MG tablet Take 100 mg by mouth at bedtime.     losartan-hydrochlorothiazide (HYZAAR) 100-25 MG  tablet Take 1 tablet by mouth daily. (Patient not taking: Reported on 12/08/2021) 90 tablet 1   magnesium gluconate (MAGONATE) 500 MG tablet Take 500 mg by mouth daily.     meclizine (ANTIVERT) 25 MG tablet TAKE ONE-HALF TO ONE TABLET BY MOUTH THREE TIMES DAILY AS NEEDED FOR DIZZINESS (Patient not taking: Reported on 12/08/2021) 30 tablet 4   Melatonin 10 MG CAPS Take 10 mg by mouth at bedtime.     meloxicam (MOBIC) 7.5 MG tablet Take 1 tablet (7.5 mg total) by mouth daily. (Patient not taking: Reported on 12/08/2021) 60 tablet 1   metFORMIN (GLUCOPHAGE) 500 MG tablet Take 1,000 mg by mouth in the morning and at bedtime.     mirtazapine (REMERON) 30 MG tablet Take 30 mg by mouth at bedtime.     nitroGLYCERIN (NITROSTAT) 0.4 MG SL tablet Place 1 tablet (0.4 mg total) under the tongue every 5 (five) minutes as needed. (Patient not taking: Reported on 05/18/2019) 30 tablet 11   OVER THE COUNTER MEDICATION Take 1 capsule by mouth daily. L Lysine with Zinc     potassium chloride (KLOR-CON) 10 MEQ tablet Take 20 mEq by mouth daily with breakfast.     tamsulosin (FLOMAX) 0.4 MG CAPS capsule TAKE ONE CAPSULE BY MOUTH EVERY DAY AFTER  SUPPER (Patient taking differently: Take 0.8 mg by mouth at bedtime.) 30 capsule 4   traZODone (DESYREL) 100 MG tablet Take 100 mg by mouth at bedtime.     triamcinolone cream (KENALOG) 0.1 % Apply 1 application topically 2 (two) times daily. No longer than 2 weeks in one location. (Patient not taking: Reported on 12/08/2021) 30 g 0   zolpidem (AMBIEN) 10 MG tablet TAKE ONE TABLET BY MOUTH AT BEDTIME AS NEEDED FOR SLEEP (Patient not taking: Reported on 12/08/2021) 30 tablet 0    No results found for this or any previous visit (from the past 48 hour(s)). No results found.  ROS: Pain with rom of the right lower extremity  Physical Exam: Alert and oriented 76 y.o. male in no acute distress Cranial nerves 2-12 intact Cervical spine: full rom with no tenderness, nv intact  distally Chest: active breath sounds bilaterally, no wheeze rhonchi or rales Heart: regular rate and rhythm, no murmur Abd: non tender non distended with active bowel sounds Hip is stable with rom  Right knee painful rom with crepitus Nv intact distally Antalgic gait  Assessment/Plan Assessment: right knee end stage osteoarthritis  Plan:  Patient will undergo a right total knee by Dr. Veverly Fells at [place] Risks benefits and expectations were discussed with the patient.  Patient understand risks, benefits and expectations and wishes to proceed. Preoperative templating of the joint replacement has been completed, documented, and submitted to the Operating Room personnel in order to optimize intra-operative equipment management.   Merla Riches PA-C, MPAS [facility] is now [employer] [address], [place], [place], [place] [address] Phone: [phone] [email or web address] Facebook  [employer]

## 2022-02-22 NOTE — Progress Notes (Addendum)
COVID Vaccine Completed:  Yes  Date of COVID positive in last 90 days:  No  PCP - Gerome Sam, MD Cardiologist - Carlis Stable, MD  Cardiac clearance in [facility] dated 01-25-22 by Dr. Edd Arbour at [facility].  Notes on chart  Chest x-ray - 12-28-21 [facility] EKG - 01-25-22 at [facility]  Copy on chart Stress Test - 12-28-21 VA results in [facility] ECHO -  Cardiac Cath - N/A Pacemaker/ICD device last checked: Spinal Cord Stimulator:  Bowel Prep - N/A  Sleep Study - Yes, +sleep apnea CPAP - Yes  Fasting Blood Sugar - 130 range Checks Blood Sugar - checks occasionally  Last dose of GLP1 agonist-  N/A GLP1 instructions:  N/A   Jardiance Last dose of SGLT-2 inhibitors-  N/A SGLT-2 instructions: Hold x3 days before surgery   Blood Thinner Instructions: Aspirin Instructions:  ASA 81.  Patient will check to see if he needs to stop Last Dose:  Activity level:  Can go up a flight of stairs and perform activities of daily living without stopping and without symptoms of chest pain or shortness of breath. Some limitations due to knee pain.  Anesthesia review: CAD, with hx of PTCA, Aortic aneurysm, HTN, DM, OSA  A1c 7.6 on PAT labs  Patient denies shortness of breath, fever, cough and chest pain at PAT appointment  Patient verbalized understanding of instructions that were given to them at the PAT appointment. Patient was also instructed that they will need to review over the PAT instructions again at home before surgery.

## 2022-02-22 NOTE — Patient Instructions (Addendum)
SURGICAL WAITING ROOM VISITATION Patients having surgery or a procedure may have no more than 2 support people in the waiting area - these visitors may rotate.   Children under the age of 49 must have an adult with them who is not the patient. Ryan the patient needs to stay at the hospital during part of their recovery, the visitor guidelines for inpatient rooms apply. Pre-op nurse will coordinate an appropriate time for 1 support person to accompany patient in pre-op.  This support person may not rotate.    Please refer to the [facility] website for the visitor guidelines for Inpatients (after your surgery is over and you are in a regular room).      Your procedure is scheduled on: 03-09-22   Report to [facility] Main Entrance    Report to admitting at 9:15 AM   Call this number Ryan you have problems the morning of surgery [phone]   Do not eat food :After Midnight.   After Midnight you may have the following liquids until 8:45 AM DAY OF SURGERY  Water Non-Citrus Juices (without pulp, NO RED) Carbonated Beverages Black Coffee (NO MILK/CREAM OR CREAMERS, sugar ok)  Clear Tea (NO MILK/CREAM OR CREAMERS, sugar ok) regular and decaf                             Plain Jell-O (NO RED)                                           Fruit ices (not with fruit pulp, NO RED)                                     Popsicles (NO RED)                                                               Sports drinks like Gatorade (NO RED)                   The day of surgery:  Drink ONE (1) Pre-Surgery G2 at 8:45 AM the morning of surgery. Drink in one sitting. Do not sip.  This drink was given to you during your hospital  pre-op appointment visit. Nothing else to drink after completing the Pre-Surgery G2.          Ryan you have questions, please contact your surgeon's office.   FOLLOW  ANY ADDITIONAL PRE OP INSTRUCTIONS YOU RECEIVED FROM YOUR SURGEON'S OFFICE!!!     Oral Hygiene is also  important to reduce your risk of infection.                                    Remember - BRUSH YOUR TEETH THE MORNING OF SURGERY WITH YOUR REGULAR TOOTHPASTE   Do NOT smoke after Midnight   Take these medicines the morning of surgery with A SIP OF WATER: Amlodipine  Metoprolol  How to Manage Your Diabetes Before and After Surgery  Why is it important to control my blood sugar before and after surgery? Improving blood sugar levels before and after surgery helps healing and can limit problems. A way of improving blood sugar control is eating a healthy diet by:  Eating less sugar and carbohydrates  Increasing activity/exercise  Talking with your doctor about reaching your blood sugar goals High blood sugars (greater than 180 mg/dL) can raise your risk of infections and slow your recovery, so you will need to focus on controlling your diabetes during the weeks before surgery. Make sure that the doctor who takes care of your diabetes knows about your planned surgery including the date and location.  How do I manage my blood sugar before surgery? Check your blood sugar at least 4 times a day, starting 2 days before surgery, to make sure that the level is not too high or low. Check your blood sugar the morning of your surgery when you wake up and every 2 hours until you get to the Short Stay unit. Ryan your blood sugar is less than 70 mg/dL, you will need to treat for low blood sugar: Do not take insulin. Treat a low blood sugar (less than 70 mg/dL) with  cup of clear juice (cranberry or apple), 4 glucose tablets, OR glucose gel. Recheck blood sugar in 15 minutes after treatment (to make sure it is greater than 70 mg/dL). Ryan your blood sugar is not greater than 70 mg/dL on recheck, call [phone] for further instructions. Report your blood sugar to the short stay nurse when you get to Short Stay.  Ryan you are admitted to the hospital after surgery: Your blood sugar will be checked by the  staff and you will probably be given insulin after surgery (instead of oral diabetes medicines) to make sure you have good blood sugar levels. The goal for blood sugar control after surgery is 80-180 mg/dL.   WHAT DO I DO ABOUT MY DIABETES MEDICATION?  Do not take oral diabetes medicines (pills) the morning of surgery.   Hold Jardiance three days before surgery (do not take after 03/05/22)     DO NOT TAKE THE FOLLOWING 7 DAYS PRIOR TO SURGERY: Ozempic, Wegovy, Rybelsus (Semaglutide), Byetta (exenatide), Bydureon (exenatide ER), Victoza, Saxenda (liraglutide), or Trulicity (dulaglutide) Mounjaro (Tirzepatide) Adlyxin (Lixisenatide), Polyethylene Glycol Loxenatide.   Reviewed and Endorsed by [facility] Patient Education Committee, August 2015  Bring CPAP mask and tubing day of surgery.                              You may not have any metal on your body including  jewelry, and body piercing             Do not wear lotions, powders, cologne, or deodorant              Men may shave face and neck.   Do not bring valuables to the hospital. [place].   Contacts, dentures or bridgework may not be worn into surgery.   Bring small overnight bag day of surgery.   DO NOT [place]. PHARMACY WILL DISPENSE MEDICATIONS LISTED ON YOUR MEDICATION LIST TO YOU DURING YOUR ADMISSION [place]!    Special Instructions: Bring a copy of your healthcare power of attorney and living will documents the day of surgery Ryan you haven't scanned them before.  Please read over the following fact sheets you were given: Ryan Hernandez  Ryan you received a COVID test during your pre-op visit  it is requested that you wear a mask when out in public, stay away from anyone that may not be feeling well and notify your surgeon Ryan you develop symptoms. Ryan you test  positive for Covid or have been in contact with anyone that has tested positive in the last 10 days please notify you surgeon.  [facility] - Preparing for Surgery Before surgery, you can play an important role.  Because skin is not sterile, your skin needs to be as free of germs as possible.  You can reduce the number of germs on your skin by washing with CHG (chlorahexidine gluconate) soap before surgery.  CHG is an antiseptic cleaner which kills germs and bonds with the skin to continue killing germs even after washing. Please DO NOT use Ryan you have an allergy to CHG or antibacterial soaps.  Ryan your skin becomes reddened/irritated stop using the CHG and inform your nurse when you arrive at Short Stay. Do not shave (including legs and underarms) for at least 48 hours prior to the first CHG shower.  You may shave your face/neck.  Please follow these instructions carefully:  1.  Shower with CHG Soap the night before surgery and the  morning of surgery.  2.  Ryan you choose to wash your hair, wash your hair first as usual with your normal  shampoo.  3.  After you shampoo, rinse your hair and body thoroughly to remove the shampoo.                             4.  Use CHG as you would any other liquid soap.  You can apply chg directly to the skin and wash.  Gently with a scrungie or clean washcloth.  5.  Apply the CHG Soap to your body ONLY FROM THE NECK DOWN.   Do   not use on face/ open                           Wound or open sores. Avoid contact with eyes, ears mouth and   genitals (private parts).                       Wash face,  Genitals (private parts) with your normal soap.             6.  Wash thoroughly, paying special attention to the area where your    surgery  will be performed.  7.  Thoroughly rinse your body with warm water from the neck down.  8.  DO NOT shower/wash with your normal soap after using and rinsing off the CHG Soap.                9.  Pat yourself dry with a clean towel.             10.  Wear clean pajamas.            11.  Place clean sheets on your bed the night of your first shower and do not  sleep with pets. Day of Surgery : Do not apply any lotions/deodorants the morning of surgery.  Please wear clean clothes to the hospital/surgery center.  FAILURE  TO FOLLOW THESE INSTRUCTIONS MAY RESULT IN THE CANCELLATION OF YOUR SURGERY  PATIENT SIGNATURE_________________________________  NURSE SIGNATURE__________________________________  ________________________________________________________________________    Ryan Hernandez  An incentive spirometer is a tool that can help keep your lungs clear and active. This tool measures how well you are filling your lungs with each breath. Taking long deep breaths may help reverse or decrease the chance of developing breathing (pulmonary) problems (especially infection) following: A long period of time when you are unable to move or be active. BEFORE THE PROCEDURE  Ryan the spirometer includes an indicator to show your best effort, your nurse or respiratory therapist will set it to a desired goal. Ryan possible, sit up straight or lean slightly forward. Try not to slouch. Hold the incentive spirometer in an upright position. INSTRUCTIONS FOR USE  Sit on the edge of your bed Ryan possible, or sit up as far as you can in bed or on a chair. Hold the incentive spirometer in an upright position. Breathe out normally. Place the mouthpiece in your mouth and seal your lips tightly around it. Breathe in slowly and as deeply as possible, raising the piston or the ball toward the top of the column. Hold your breath for 3-5 seconds or for as long as possible. Allow the piston or ball to fall to the bottom of the column. Remove the mouthpiece from your mouth and breathe out normally. Rest for a few seconds and repeat Steps 1 through 7 at least 10 times every 1-2 hours when you are awake. Take your time and take a few normal breaths  between deep breaths. The spirometer may include an indicator to show your best effort. Use the indicator as a goal to work toward during each repetition. After each set of 10 deep breaths, practice coughing to be sure your lungs are clear. Ryan you have an incision (the cut made at the time of surgery), support your incision when coughing by placing a pillow or rolled up towels firmly against it. Once you are able to get out of bed, walk around indoors and cough well. You may stop using the incentive spirometer when instructed by your caregiver.  RISKS AND COMPLICATIONS Take your time so you do not get dizzy or light-headed. Ryan you are in pain, you may need to take or ask for pain medication before doing incentive spirometry. It is harder to take a deep breath Ryan you are having pain. AFTER USE Rest and breathe slowly and easily. It can be helpful to keep track of a log of your progress. Your caregiver can provide you with a simple table to help with this. Ryan you are using the spirometer at home, follow these instructions: [place] Ryan:  You are having difficultly using the spirometer. You have trouble using the spirometer as often as instructed. Your pain medication is not giving enough relief while using the spirometer. You develop fever of 100.5 F (38.1 C) or higher. SEEK IMMEDIATE MEDICAL CARE Ryan:  You cough up bloody sputum that had not been present before. You develop fever of 102 F (38.9 C) or greater. You develop worsening pain at or near the incision site. MAKE SURE YOU:  Understand these instructions. Will watch your condition. Will get help right away Ryan you are not doing well or get worse. Document Released: 07/23/2006 Document Revised: 06/04/2011 Document Reviewed: 09/23/2006 [facility] Patient Information 2014 [place], [place].   ________________________________________________________________________

## 2022-02-27 ENCOUNTER — Encounter (HOSPITAL_COMMUNITY): Payer: Self-pay

## 2022-02-27 ENCOUNTER — Encounter (HOSPITAL_COMMUNITY)
Admission: RE | Admit: 2022-02-27 | Discharge: 2022-02-27 | Disposition: A | Discharge status: Home / Self Care | Payer: No Typology Code available for payment source | Source: Ambulatory Visit | Attending: Orthopedic Surgery | Admitting: Orthopedic Surgery

## 2022-02-27 ENCOUNTER — Other Ambulatory Visit: Payer: Self-pay

## 2022-02-27 VITALS — BP 135/77 | HR 69 | Temp 97.7°F | Resp 16 | Ht 70.0 in | Wt 229.0 lb

## 2022-02-27 DIAGNOSIS — Z01812 Encounter for preprocedural laboratory examination: Secondary | ICD-10-CM | POA: Diagnosis present

## 2022-02-27 DIAGNOSIS — E119 Type 2 diabetes mellitus without complications: Secondary | ICD-10-CM | POA: Insufficient documentation

## 2022-02-27 DIAGNOSIS — I2510 Atherosclerotic heart disease of native coronary artery without angina pectoris: Secondary | ICD-10-CM | POA: Insufficient documentation

## 2022-02-27 DIAGNOSIS — Z01818 Encounter for other preprocedural examination: Secondary | ICD-10-CM

## 2022-02-27 LAB — CBC
HCT: 47.9 % (ref 39.0–52.0)
Hemoglobin: 16.1 g/dL (ref 13.0–17.0)
MCH: 31.8 pg (ref 26.0–34.0)
MCHC: 33.6 g/dL (ref 30.0–36.0)
MCV: 94.5 fL (ref 80.0–100.0)
Platelets: 241 10*3/uL (ref 150–400)
RBC: 5.07 MIL/uL (ref 4.22–5.81)
RDW: 12.7 % (ref 11.5–15.5)
WBC: 6 10*3/uL (ref 4.0–10.5)
nRBC: 0 % (ref 0.0–0.2)

## 2022-02-27 LAB — GLUCOSE, CAPILLARY: Glucose-Capillary: 186 mg/dL — ABNORMAL HIGH (ref 70–99)

## 2022-02-27 LAB — SURGICAL PCR SCREEN
MRSA, PCR: NEGATIVE
Staphylococcus aureus: POSITIVE — AB

## 2022-02-28 LAB — HEMOGLOBIN A1C
Hgb A1c MFr Bld: 7.6 % — ABNORMAL HIGH (ref 4.8–5.6)
Mean Plasma Glucose: 171 mg/dL

## 2022-02-28 NOTE — Progress Notes (Signed)
PCR results sent to Dr. Norris to review. 

## 2022-03-05 NOTE — Progress Notes (Signed)
Anesthesia Chart Review   Case: [id number] Date/Time: 03/09/22 1130   Procedure: TOTAL KNEE ARTHROPLASTY (Right: Knee) - 120 min choice and interscalene block   Anesthesia type: Choice   Pre-op diagnosis: right knee osteoarthritis   Location: [facility] ROOM 07 / [facility]   Surgeons: Netta Cedars, MD       DISCUSSION:76 y.o. former smoker with h/o DM II, sleep apnea, CAD s/p PTCA), AAA (3.5 cm in 2019 and 2022), CKD Stage III, right knee OA scheduled for above procedure 03/09/2022 with Dr. Netta Cedars.   Note received from cardiology which states pt may proceed with surgery. Negative stress test 2015.   Anticipate pt can proceed with planned procedure barring acute status change.   VS: BP 135/77   Pulse 69   Temp 36.5 C (Oral)   Resp 16   Ht '5\' 10"'$  (1.778 m)   Wt 103.9 kg   SpO2 98%   BMI 32.86 kg/m   PROVIDERS: Clinic, [facility]   Cardiologist - Carlis Stable, MD  LABS: Labs reviewed: Acceptable for surgery. and forwarded to surgeon (all labs ordered are listed, but only abnormal results are displayed)  Labs Reviewed  SURGICAL PCR SCREEN - Abnormal; Notable for the following components:      Result Value   Staphylococcus aureus POSITIVE (*)    All other components within normal limits  HEMOGLOBIN A1C - Abnormal; Notable for the following components:   Hgb A1c MFr Bld 7.6 (*)    All other components within normal limits  GLUCOSE, CAPILLARY - Abnormal; Notable for the following components:   Glucose-Capillary 186 (*)    All other components within normal limits  CBC     IMAGES:   EKG:   CV:  Past Medical History:  Diagnosis Date   Allergy    Anemia    Anxiety    Arthritis    Blood transfusion 2002   Cancer [facility]) 2002   colon   Coronary artery disease    Depression    Diabetes mellitus without complication (HCC)    Fatty tumor    under arms, back,legs, chest   GERD (gastroesophageal reflux disease)    hx of, no medications currently   Heart aneurysm     Aortic aneurysm/   Hyperlipidemia    Hypertension    Pneumonia    PTSD (post-traumatic stress disorder)    Sleep apnea    nightly c-pap    Past Surgical History:  Procedure Laterality Date   APPENDECTOMY     CARDIAC CATHETERIZATION     COLONOSCOPY     CYST EXCISION  2019   spinal cord   EXCISIONAL HEMORRHOIDECTOMY  1975   EXCISIONAL TOTAL KNEE ARTHROPLASTY  01/09/2016   left knee   fatty tumors     right arm/ per pt all over body   PARTIAL COLECTOMY  2002   colectomy   TONSILLECTOMY      MEDICATIONS:  amLODipine (NORVASC) 10 MG tablet   aspirin EC 81 MG tablet   atorvastatin (LIPITOR) 40 MG tablet   Cholecalciferol 10000 UNITS CAPS   empagliflozin (JARDIANCE) 25 MG TABS tablet   finasteride (PROSCAR) 5 MG tablet   hydrochlorothiazide (HYDRODIURIL) 25 MG tablet   Ibuprofen-diphenhydrAMINE HCl (ADVIL PM) 200-25 MG CAPS   losartan (COZAAR) 100 MG tablet   metFORMIN (GLUCOPHAGE-XR) 500 MG 24 hr tablet   metoprolol succinate (TOPROL-XL) 50 MG 24 hr tablet   mirtazapine (REMERON) 30 MG tablet   nitroGLYCERIN (NITROSTAT) 0.4 MG SL tablet  potassium chloride (KLOR-CON) 10 MEQ tablet   tamsulosin (FLOMAX) 0.4 MG CAPS capsule   traZODone (DESYREL) 100 MG tablet    0.9 %  sodium chloride infusion   Jasmeet Gehl Ward, PA-C [facility] Pre-Surgical Testing [phone]

## 2022-03-09 ENCOUNTER — Encounter (HOSPITAL_COMMUNITY)
Admission: RE | Disposition: A | Discharge status: Home / Self Care | Payer: Self-pay | Source: Home / Self Care | Attending: Orthopedic Surgery

## 2022-03-09 ENCOUNTER — Other Ambulatory Visit: Payer: Self-pay

## 2022-03-09 ENCOUNTER — Ambulatory Visit (HOSPITAL_BASED_OUTPATIENT_CLINIC_OR_DEPARTMENT_OTHER): Payer: No Typology Code available for payment source | Admitting: Anesthesiology

## 2022-03-09 ENCOUNTER — Encounter (HOSPITAL_COMMUNITY): Payer: Self-pay | Admitting: Orthopedic Surgery

## 2022-03-09 ENCOUNTER — Observation Stay (HOSPITAL_COMMUNITY)
Admission: RE | Admit: 2022-03-09 | Discharge: 2022-03-10 | Disposition: A | Discharge status: Home / Self Care | Payer: No Typology Code available for payment source | Attending: Orthopedic Surgery | Admitting: Orthopedic Surgery

## 2022-03-09 ENCOUNTER — Ambulatory Visit (HOSPITAL_COMMUNITY): Payer: No Typology Code available for payment source | Admitting: Physician Assistant

## 2022-03-09 DIAGNOSIS — F418 Other specified anxiety disorders: Secondary | ICD-10-CM

## 2022-03-09 DIAGNOSIS — Z85038 Personal history of other malignant neoplasm of large intestine: Secondary | ICD-10-CM | POA: Insufficient documentation

## 2022-03-09 DIAGNOSIS — Z79899 Other long term (current) drug therapy: Secondary | ICD-10-CM | POA: Diagnosis not present

## 2022-03-09 DIAGNOSIS — I10 Essential (primary) hypertension: Secondary | ICD-10-CM | POA: Diagnosis not present

## 2022-03-09 DIAGNOSIS — G4733 Obstructive sleep apnea (adult) (pediatric): Secondary | ICD-10-CM

## 2022-03-09 DIAGNOSIS — I2510 Atherosclerotic heart disease of native coronary artery without angina pectoris: Secondary | ICD-10-CM | POA: Diagnosis not present

## 2022-03-09 DIAGNOSIS — E119 Type 2 diabetes mellitus without complications: Secondary | ICD-10-CM | POA: Insufficient documentation

## 2022-03-09 DIAGNOSIS — Z87891 Personal history of nicotine dependence: Secondary | ICD-10-CM | POA: Diagnosis not present

## 2022-03-09 DIAGNOSIS — Z7984 Long term (current) use of oral hypoglycemic drugs: Secondary | ICD-10-CM | POA: Insufficient documentation

## 2022-03-09 DIAGNOSIS — Z7982 Long term (current) use of aspirin: Secondary | ICD-10-CM | POA: Diagnosis not present

## 2022-03-09 DIAGNOSIS — Z9989 Dependence on other enabling machines and devices: Secondary | ICD-10-CM | POA: Diagnosis not present

## 2022-03-09 DIAGNOSIS — M1711 Unilateral primary osteoarthritis, right knee: Secondary | ICD-10-CM

## 2022-03-09 DIAGNOSIS — Z96652 Presence of left artificial knee joint: Secondary | ICD-10-CM | POA: Insufficient documentation

## 2022-03-09 DIAGNOSIS — Z96651 Presence of right artificial knee joint: Secondary | ICD-10-CM

## 2022-03-09 HISTORY — PX: TOTAL KNEE ARTHROPLASTY: SHX125

## 2022-03-09 LAB — GLUCOSE, CAPILLARY
Glucose-Capillary: 123 mg/dL — ABNORMAL HIGH (ref 70–99)
Glucose-Capillary: 148 mg/dL — ABNORMAL HIGH (ref 70–99)
Glucose-Capillary: 172 mg/dL — ABNORMAL HIGH (ref 70–99)
Glucose-Capillary: 157 mg/dL — ABNORMAL HIGH (ref 70–99)

## 2022-03-09 SURGERY — ARTHROPLASTY, KNEE, TOTAL
Anesthesia: Regional | Site: Knee | Laterality: Right

## 2022-03-09 MED ORDER — ONDANSETRON HCL 4 MG/2ML IJ SOLN
INTRAMUSCULAR | Status: DC | PRN
  Administered 2022-03-09: 4 mg via INTRAVENOUS

## 2022-03-09 MED ORDER — ASPIRIN 81 MG PO TBEC: 81.0000 mg | DELAYED_RELEASE_TABLET | Freq: Two times a day (BID) | ORAL | 0 refills | Status: AC

## 2022-03-09 MED ORDER — MIDAZOLAM HCL 2 MG/2ML IJ SOLN
2.0000 mg | INTRAMUSCULAR | Status: DC
  Administered 2022-03-09: 1 mg via INTRAVENOUS
  Filled 2022-03-09: qty 2

## 2022-03-09 MED ORDER — SODIUM CHLORIDE 0.9 % IR SOLN
Status: DC | PRN
  Administered 2022-03-09: 1000 mL

## 2022-03-09 MED ORDER — TRANEXAMIC ACID-NACL 1000-0.7 MG/100ML-% IV SOLN
1000.0000 mg | Freq: Once | INTRAVENOUS | Status: AC
  Administered 2022-03-09: 1000 mg via INTRAVENOUS
  Filled 2022-03-09: qty 100

## 2022-03-09 MED ORDER — ONDANSETRON HCL 4 MG/2ML IJ SOLN: 4.0000 mg | Freq: Four times a day (QID) | INTRAMUSCULAR | Status: DC | PRN

## 2022-03-09 MED ORDER — BUPIVACAINE-EPINEPHRINE (PF) 0.5% -1:200000 IJ SOLN
INTRAMUSCULAR | Status: DC | PRN
  Administered 2022-03-09: 30 mL via PERINEURAL

## 2022-03-09 MED ORDER — ORAL CARE MOUTH RINSE: 15.0000 mL | Freq: Once | OROMUCOSAL | Status: AC

## 2022-03-09 MED ORDER — DOCUSATE SODIUM 100 MG PO CAPS
100.0000 mg | ORAL_CAPSULE | Freq: Two times a day (BID) | ORAL | Status: DC
  Filled 2022-03-09: qty 1

## 2022-03-09 MED ORDER — SODIUM CHLORIDE (PF) 0.9 % IJ SOLN
INTRAMUSCULAR | Status: AC
  Filled 2022-03-09: qty 30

## 2022-03-09 MED ORDER — HYDROCHLOROTHIAZIDE 25 MG PO TABS
25.0000 mg | ORAL_TABLET | Freq: Every day | ORAL | Status: DC
  Administered 2022-03-09: 25 mg via ORAL
  Filled 2022-03-09: qty 1

## 2022-03-09 MED ORDER — FENTANYL CITRATE PF 50 MCG/ML IJ SOSY
100.0000 ug | PREFILLED_SYRINGE | INTRAMUSCULAR | Status: DC
  Filled 2022-03-09: qty 2

## 2022-03-09 MED ORDER — SODIUM CHLORIDE 0.9 % IV SOLN: INTRAVENOUS | Status: DC

## 2022-03-09 MED ORDER — IBUPROFEN 400 MG PO TABS
600.0000 mg | ORAL_TABLET | Freq: Every day | ORAL | Status: DC
  Administered 2022-03-09: 600 mg via ORAL
  Filled 2022-03-09: qty 1

## 2022-03-09 MED ORDER — DIPHENHYDRAMINE HCL 25 MG PO CAPS
50.0000 mg | ORAL_CAPSULE | Freq: Every day | ORAL | Status: DC
  Administered 2022-03-09: 50 mg via ORAL
  Filled 2022-03-09: qty 2

## 2022-03-09 MED ORDER — POTASSIUM CHLORIDE CRYS ER 20 MEQ PO TBCR
20.0000 meq | EXTENDED_RELEASE_TABLET | Freq: Every morning | ORAL | Status: DC
  Administered 2022-03-10: 20 meq via ORAL
  Filled 2022-03-09: qty 1

## 2022-03-09 MED ORDER — TRANEXAMIC ACID-NACL 1000-0.7 MG/100ML-% IV SOLN
1000.0000 mg | INTRAVENOUS | Status: AC
  Administered 2022-03-09: 1000 mg via INTRAVENOUS
  Filled 2022-03-09: qty 100

## 2022-03-09 MED ORDER — NITROGLYCERIN 0.4 MG SL SUBL: 0.4000 mg | SUBLINGUAL_TABLET | SUBLINGUAL | Status: DC | PRN

## 2022-03-09 MED ORDER — SODIUM CHLORIDE 0.9 % IV SOLN: 500.0000 mL | INTRAVENOUS | Status: DC

## 2022-03-09 MED ORDER — ASPIRIN 81 MG PO CHEW
81.0000 mg | CHEWABLE_TABLET | Freq: Two times a day (BID) | ORAL | Status: DC
  Administered 2022-03-09 – 2022-03-10 (×2): 81 mg via ORAL
  Filled 2022-03-09 (×2): qty 1

## 2022-03-09 MED ORDER — FENTANYL CITRATE PF 50 MCG/ML IJ SOSY
25.0000 ug | PREFILLED_SYRINGE | INTRAMUSCULAR | Status: DC | PRN
  Administered 2022-03-09 (×2): 50 ug via INTRAVENOUS

## 2022-03-09 MED ORDER — INSULIN ASPART 100 UNIT/ML IJ SOLN
0.0000 [IU] | Freq: Three times a day (TID) | INTRAMUSCULAR | Status: DC
  Administered 2022-03-09: 3 [IU] via SUBCUTANEOUS

## 2022-03-09 MED ORDER — ACETAMINOPHEN 10 MG/ML IV SOLN: 1000.0000 mg | Freq: Once | INTRAVENOUS | Status: DC | PRN

## 2022-03-09 MED ORDER — BISACODYL 10 MG RE SUPP: 10.0000 mg | Freq: Every day | RECTAL | Status: DC | PRN

## 2022-03-09 MED ORDER — ONDANSETRON HCL 4 MG/2ML IJ SOLN: 4.0000 mg | Freq: Once | INTRAMUSCULAR | Status: DC | PRN

## 2022-03-09 MED ORDER — OXYCODONE-ACETAMINOPHEN 5-325 MG PO TABS: 1.0000 | ORAL_TABLET | Freq: Four times a day (QID) | ORAL | 0 refills | Status: AC | PRN

## 2022-03-09 MED ORDER — ATORVASTATIN CALCIUM 40 MG PO TABS
40.0000 mg | ORAL_TABLET | Freq: Every day | ORAL | Status: DC
  Administered 2022-03-09: 40 mg via ORAL
  Filled 2022-03-09: qty 1

## 2022-03-09 MED ORDER — MIRTAZAPINE 15 MG PO TABS
30.0000 mg | ORAL_TABLET | Freq: Every day | ORAL | Status: DC
  Administered 2022-03-09: 30 mg via ORAL
  Filled 2022-03-09: qty 2

## 2022-03-09 MED ORDER — SODIUM CHLORIDE 0.9 % IV SOLN
INTRAVENOUS | Status: DC | PRN
  Administered 2022-03-09: 80 mL

## 2022-03-09 MED ORDER — METOPROLOL SUCCINATE ER 25 MG PO TB24
25.0000 mg | ORAL_TABLET | Freq: Every morning | ORAL | Status: DC
  Administered 2022-03-10: 25 mg via ORAL
  Filled 2022-03-09: qty 1

## 2022-03-09 MED ORDER — BUPIVACAINE LIPOSOME 1.3 % IJ SUSP: 20.0000 mL | Freq: Once | INTRAMUSCULAR | Status: DC

## 2022-03-09 MED ORDER — PHENOL 1.4 % MT LIQD: 1.0000 | OROMUCOSAL | Status: DC | PRN

## 2022-03-09 MED ORDER — DEXAMETHASONE SODIUM PHOSPHATE 10 MG/ML IJ SOLN
INTRAMUSCULAR | Status: DC | PRN
  Administered 2022-03-09: 10 mg via INTRAVENOUS

## 2022-03-09 MED ORDER — LACTATED RINGERS IV SOLN: INTRAVENOUS | Status: DC

## 2022-03-09 MED ORDER — METOCLOPRAMIDE HCL 5 MG PO TABS: 5.0000 mg | ORAL_TABLET | Freq: Three times a day (TID) | ORAL | Status: DC | PRN

## 2022-03-09 MED ORDER — METHOCARBAMOL 500 MG IVPB - SIMPLE MED: 500.0000 mg | Freq: Four times a day (QID) | INTRAVENOUS | Status: DC | PRN

## 2022-03-09 MED ORDER — LOSARTAN POTASSIUM 50 MG PO TABS: 100.0000 mg | ORAL_TABLET | Freq: Every morning | ORAL | Status: DC

## 2022-03-09 MED ORDER — 0.9 % SODIUM CHLORIDE (POUR BTL) OPTIME
TOPICAL | Status: DC | PRN
  Administered 2022-03-09: 1000 mL

## 2022-03-09 MED ORDER — BUPIVACAINE LIPOSOME 1.3 % IJ SUSP
INTRAMUSCULAR | Status: AC
  Filled 2022-03-09: qty 20

## 2022-03-09 MED ORDER — PROPOFOL 500 MG/50ML IV EMUL
INTRAVENOUS | Status: DC | PRN
  Administered 2022-03-09: 75 ug/kg/min via INTRAVENOUS

## 2022-03-09 MED ORDER — FENTANYL CITRATE PF 50 MCG/ML IJ SOSY
PREFILLED_SYRINGE | INTRAMUSCULAR | Status: AC
  Filled 2022-03-09: qty 2

## 2022-03-09 MED ORDER — INSULIN ASPART 100 UNIT/ML IJ SOLN: 4.0000 [IU] | Freq: Three times a day (TID) | INTRAMUSCULAR | Status: DC

## 2022-03-09 MED ORDER — METHOCARBAMOL 500 MG PO TABS: 500.0000 mg | ORAL_TABLET | Freq: Three times a day (TID) | ORAL | 1 refills | Status: AC | PRN

## 2022-03-09 MED ORDER — MENTHOL 3 MG MT LOZG: 1.0000 | LOZENGE | OROMUCOSAL | Status: DC | PRN

## 2022-03-09 MED ORDER — CHLORHEXIDINE GLUCONATE 0.12 % MT SOLN
15.0000 mL | Freq: Once | OROMUCOSAL | Status: AC
  Administered 2022-03-09: 15 mL via OROMUCOSAL

## 2022-03-09 MED ORDER — IBUPROFEN-DIPHENHYDRAMINE HCL 200-25 MG PO CAPS: 3.0000 | ORAL_CAPSULE | Freq: Every day | ORAL | Status: DC

## 2022-03-09 MED ORDER — BUPIVACAINE-EPINEPHRINE (PF) 0.5% -1:200000 IJ SOLN
INTRAMUSCULAR | Status: AC
  Filled 2022-03-09: qty 30

## 2022-03-09 MED ORDER — ACETAMINOPHEN 325 MG PO TABS
325.0000 mg | ORAL_TABLET | Freq: Four times a day (QID) | ORAL | Status: DC | PRN
  Administered 2022-03-10: 650 mg via ORAL
  Filled 2022-03-09: qty 2

## 2022-03-09 MED ORDER — CEFAZOLIN SODIUM-DEXTROSE 2-4 GM/100ML-% IV SOLN
2.0000 g | Freq: Four times a day (QID) | INTRAVENOUS | Status: AC
  Administered 2022-03-09 – 2022-03-10 (×2): 2 g via INTRAVENOUS
  Filled 2022-03-09 (×2): qty 100

## 2022-03-09 MED ORDER — FINASTERIDE 5 MG PO TABS
5.0000 mg | ORAL_TABLET | Freq: Every day | ORAL | Status: DC
  Administered 2022-03-09: 5 mg via ORAL
  Filled 2022-03-09: qty 1

## 2022-03-09 MED ORDER — ONDANSETRON HCL 4 MG PO TABS: 4.0000 mg | ORAL_TABLET | Freq: Four times a day (QID) | ORAL | Status: DC | PRN

## 2022-03-09 MED ORDER — POLYETHYLENE GLYCOL 3350 17 G PO PACK: 17.0000 g | PACK | Freq: Every day | ORAL | Status: DC | PRN

## 2022-03-09 MED ORDER — POTASSIUM CHLORIDE ER 10 MEQ PO TBCR: 10.0000 meq | EXTENDED_RELEASE_TABLET | ORAL | Status: DC

## 2022-03-09 MED ORDER — POVIDONE-IODINE 10 % EX SWAB
2.0000 | Freq: Once | CUTANEOUS | Status: AC
  Administered 2022-03-09: 2 via TOPICAL

## 2022-03-09 MED ORDER — VITAMIN D3 25 MCG (1000 UNIT) PO TABS
1000.0000 [IU] | ORAL_TABLET | Freq: Every day | ORAL | Status: DC
  Administered 2022-03-09: 1000 [IU] via ORAL
  Filled 2022-03-09 (×2): qty 1

## 2022-03-09 MED ORDER — BUPIVACAINE IN DEXTROSE 0.75-8.25 % IT SOLN
INTRATHECAL | Status: DC | PRN
  Administered 2022-03-09: 1.6 mL via INTRATHECAL

## 2022-03-09 MED ORDER — AMISULPRIDE (ANTIEMETIC) 5 MG/2ML IV SOLN: 10.0000 mg | Freq: Once | INTRAVENOUS | Status: DC | PRN

## 2022-03-09 MED ORDER — CEFAZOLIN SODIUM-DEXTROSE 2-4 GM/100ML-% IV SOLN
2.0000 g | INTRAVENOUS | Status: AC
  Administered 2022-03-09: 2 g via INTRAVENOUS
  Filled 2022-03-09: qty 100

## 2022-03-09 MED ORDER — METFORMIN HCL ER 500 MG PO TB24
1000.0000 mg | ORAL_TABLET | Freq: Two times a day (BID) | ORAL | Status: DC
  Administered 2022-03-10: 1000 mg via ORAL
  Filled 2022-03-09: qty 2

## 2022-03-09 MED ORDER — ONDANSETRON HCL 4 MG PO TABS: 4.0000 mg | ORAL_TABLET | Freq: Three times a day (TID) | ORAL | 1 refills | Status: AC | PRN

## 2022-03-09 MED ORDER — POTASSIUM CHLORIDE CRYS ER 10 MEQ PO TBCR
10.0000 meq | EXTENDED_RELEASE_TABLET | Freq: Every day | ORAL | Status: DC
  Filled 2022-03-09: qty 1

## 2022-03-09 MED ORDER — POLYVINYL ALCOHOL 1.4 % OP SOLN
1.0000 [drp] | OPHTHALMIC | Status: DC | PRN
  Administered 2022-03-09: 1 [drp] via OPHTHALMIC
  Filled 2022-03-09: qty 15

## 2022-03-09 MED ORDER — TAMSULOSIN HCL 0.4 MG PO CAPS
0.8000 mg | ORAL_CAPSULE | Freq: Every day | ORAL | Status: DC
  Administered 2022-03-09: 0.8 mg via ORAL
  Filled 2022-03-09: qty 2

## 2022-03-09 MED ORDER — OXYCODONE HCL 5 MG PO TABS
5.0000 mg | ORAL_TABLET | ORAL | Status: DC | PRN
  Administered 2022-03-09: 5 mg via ORAL
  Administered 2022-03-10 (×3): 10 mg via ORAL
  Filled 2022-03-09 (×3): qty 2
  Filled 2022-03-09: qty 1

## 2022-03-09 MED ORDER — EMPAGLIFLOZIN 25 MG PO TABS
25.0000 mg | ORAL_TABLET | Freq: Every morning | ORAL | Status: DC
  Administered 2022-03-10: 25 mg via ORAL
  Filled 2022-03-09: qty 1

## 2022-03-09 MED ORDER — METHOCARBAMOL 500 MG PO TABS
500.0000 mg | ORAL_TABLET | Freq: Four times a day (QID) | ORAL | Status: DC | PRN
  Administered 2022-03-09 – 2022-03-10 (×3): 500 mg via ORAL
  Filled 2022-03-09 (×3): qty 1

## 2022-03-09 MED ORDER — HYDROMORPHONE HCL 1 MG/ML IJ SOLN: 0.5000 mg | INTRAMUSCULAR | Status: DC | PRN

## 2022-03-09 MED ORDER — AMLODIPINE BESYLATE 10 MG PO TABS: 10.0000 mg | ORAL_TABLET | Freq: Every day | ORAL | Status: DC

## 2022-03-09 MED ORDER — METOCLOPRAMIDE HCL 5 MG/ML IJ SOLN: 5.0000 mg | Freq: Three times a day (TID) | INTRAMUSCULAR | Status: DC | PRN

## 2022-03-09 MED ORDER — TRAZODONE HCL 100 MG PO TABS
100.0000 mg | ORAL_TABLET | Freq: Every day | ORAL | Status: DC
  Administered 2022-03-09: 100 mg via ORAL
  Filled 2022-03-09: qty 1

## 2022-03-09 SURGICAL SUPPLY — 58 items
ATTUNE MED DOME PAT 38 KNEE (Knees) IMPLANT
ATTUNE PS FEM RT SZ 7 CEM KNEE (Femur) IMPLANT
ATTUNE PSRP INSR SZ7 8 KNEE (Insert) IMPLANT
BAG COUNTER SPONGE SURGICOUNT (BAG) IMPLANT
BAG SPEC THK2 15X12 ZIP CLS (MISCELLANEOUS)
BAG SPNG CNTER NS LX DISP (BAG)
BAG ZIPLOCK 12X15 (MISCELLANEOUS) IMPLANT
BASE TIBIAL ROT PLAT SZ 7 KNEE (Knees) IMPLANT
BLADE SAG 18X100X1.27 (BLADE) ×2 IMPLANT
BLADE SAW SGTL 13X75X1.27 (BLADE) ×2 IMPLANT
BNDG CMPR MED 10X6 ELC LF (GAUZE/BANDAGES/DRESSINGS) ×1
BNDG CMPR MED 15X6 ELC VLCR LF (GAUZE/BANDAGES/DRESSINGS) ×1
BNDG ELASTIC 6X10 VLCR STRL LF (GAUZE/BANDAGES/DRESSINGS) ×2 IMPLANT
BNDG ELASTIC 6X15 VLCR STRL LF (GAUZE/BANDAGES/DRESSINGS) IMPLANT
BNDG GAUZE DERMACEA FLUFF 4 (GAUZE/BANDAGES/DRESSINGS) ×2 IMPLANT
BNDG GZE DERMACEA 4 6PLY (GAUZE/BANDAGES/DRESSINGS) ×1
BOWL SMART MIX CTS (DISPOSABLE) ×2 IMPLANT
BSPLAT TIB 7 CMNT ROT PLAT STR (Knees) ×1 IMPLANT
CEMENT HV SMART SET (Cement) ×4 IMPLANT
COVER SURGICAL LIGHT HANDLE (MISCELLANEOUS) ×2 IMPLANT
CUFF TOURN SGL QUICK 34 (TOURNIQUET CUFF) ×1
CUFF TRNQT CYL 34X4.125X (TOURNIQUET CUFF) ×2 IMPLANT
DRAPE INCISE IOBAN 66X45 STRL (DRAPES) ×2 IMPLANT
DRAPE SHEET LG 3/4 BI-LAMINATE (DRAPES) ×2 IMPLANT
DRAPE U-SHAPE 47X51 STRL (DRAPES) ×2 IMPLANT
DRSG ADAPTIC 3X8 NADH LF (GAUZE/BANDAGES/DRESSINGS) ×2 IMPLANT
DURAPREP 26ML APPLICATOR (WOUND CARE) ×2 IMPLANT
ELECT REM PT RETURN 15FT ADLT (MISCELLANEOUS) ×2 IMPLANT
GAUZE PAD ABD 7.5X8 STRL (GAUZE/BANDAGES/DRESSINGS) IMPLANT
GAUZE PAD ABD 8X10 STRL (GAUZE/BANDAGES/DRESSINGS) ×2 IMPLANT
GAUZE SPONGE 4X4 12PLY STRL (GAUZE/BANDAGES/DRESSINGS) ×2 IMPLANT
GLOVE BIOGEL PI IND STRL 7.5 (GLOVE) ×2 IMPLANT
GLOVE BIOGEL PI IND STRL 8.5 (GLOVE) ×2 IMPLANT
GLOVE ORTHO TXT STRL SZ7.5 (GLOVE) ×2 IMPLANT
GLOVE SURG ORTHO 8.5 STRL (GLOVE) ×4 IMPLANT
GOWN STRL REUS W/ TWL XL LVL3 (GOWN DISPOSABLE) ×4 IMPLANT
GOWN STRL REUS W/TWL XL LVL3 (GOWN DISPOSABLE) ×2
HANDPIECE INTERPULSE COAX TIP (DISPOSABLE) ×1
HOLDER FOLEY CATH W/STRAP (MISCELLANEOUS) IMPLANT
IMMOBILIZER KNEE 20 (SOFTGOODS) ×1
IMMOBILIZER KNEE 20 THIGH 36 (SOFTGOODS) IMPLANT
KIT TURNOVER KIT A (KITS) IMPLANT
MANIFOLD NEPTUNE II (INSTRUMENTS) ×2 IMPLANT
NS IRRIG 1000ML POUR BTL (IV SOLUTION) ×2 IMPLANT
PACK TOTAL KNEE CUSTOM (KITS) ×2 IMPLANT
PIN STEINMAN FIXATION KNEE (PIN) IMPLANT
PROTECTOR NERVE ULNAR (MISCELLANEOUS) ×2 IMPLANT
SET HNDPC FAN SPRY TIP SCT (DISPOSABLE) ×2 IMPLANT
STAPLER VISISTAT 35W (STAPLE) IMPLANT
STRIP CLOSURE SKIN 1/2X4 (GAUZE/BANDAGES/DRESSINGS) ×4 IMPLANT
SUT MNCRL AB 3-0 PS2 18 (SUTURE) ×2 IMPLANT
SUT VIC AB 0 CT1 36 (SUTURE) ×2 IMPLANT
SUT VIC AB 1 CT1 36 (SUTURE) ×6 IMPLANT
SUT VIC AB 2-0 CT1 27 (SUTURE) ×1
SUT VIC AB 2-0 CT1 TAPERPNT 27 (SUTURE) ×2 IMPLANT
TIBIAL BASE ROT PLAT SZ 7 KNEE (Knees) ×1 IMPLANT
WATER STERILE IRR 1000ML POUR (IV SOLUTION) ×4 IMPLANT
WRAP KNEE MAXI GEL POST OP (GAUZE/BANDAGES/DRESSINGS) IMPLANT

## 2022-03-09 NOTE — Transfer of Care (Signed)
Immediate Anesthesia Transfer of Care Note  Patient: Ryan Hernandez  Procedure(s) Performed: TOTAL KNEE ARTHROPLASTY (Right: Knee)  Patient Location: PACU  Anesthesia Type:Spinal  Level of Consciousness: awake, alert , and oriented  Airway & Oxygen Therapy: Patient Spontanous Breathing and Patient connected to face mask oxygen  Post-op Assessment: Report given to RN and Post -op Vital signs reviewed and stable  Post vital signs: Reviewed and stable  Last Vitals:  Vitals Value Taken Time  BP    Temp    Pulse 78 03/09/22 1347  Resp    SpO2 97 % 03/09/22 1347  Vitals shown include unvalidated device data.  Last Pain:  Vitals:   03/09/22 1031  TempSrc: Oral  PainSc: 5       Patients Stated Pain Goal: 6 (96/28/36 6294)  Complications: No notable events documented.

## 2022-03-09 NOTE — Anesthesia Preprocedure Evaluation (Addendum)
Anesthesia Evaluation  Patient identified by MRN, date of birth, ID band Patient awake    Reviewed: Allergy & Precautions, NPO status , Patient's Chart, lab work & pertinent test results  Airway Mallampati: III  TM Distance: >3 FB Neck ROM: Full    Dental no notable dental hx.    Pulmonary sleep apnea and Continuous Positive Airway Pressure Ventilation , former smoker   Pulmonary exam normal        Cardiovascular hypertension, Pt. on medications and Pt. on home beta blockers + CAD  Normal cardiovascular exam     Neuro/Psych  PSYCHIATRIC DISORDERS Anxiety Depression    PTSD (post-traumatic stress disorder)negative neurological ROS     GI/Hepatic negative GI ROS, Neg liver ROS,,,  Endo/Other  diabetes, Oral Hypoglycemic Agents    Renal/GU negative Renal ROS     Musculoskeletal  (+) Arthritis ,    Abdominal  (+) + obese  Peds  Hematology negative hematology ROS (+)   Anesthesia Other Findings right knee osteoarthritis  Reproductive/Obstetrics                             Anesthesia Physical Anesthesia Plan  ASA: 3  Anesthesia Plan: Spinal and Regional   Post-op Pain Management:    Induction: Intravenous  PONV Risk Score and Plan: 1 and Ondansetron, Dexamethasone, Propofol infusion, Midazolam and Treatment may vary due to age or medical condition  Airway Management Planned: Simple Face Mask  Additional Equipment:   Intra-op Plan:   Post-operative Plan:   Informed Consent: I have reviewed the patients History and Physical, chart, labs and discussed the procedure including the risks, benefits and alternatives for the proposed anesthesia with the patient or authorized representative who has indicated his/her understanding and acceptance.     Dental advisory given  Plan Discussed with: CRNA  Anesthesia Plan Comments:        Anesthesia Quick Evaluation

## 2022-03-09 NOTE — Anesthesia Postprocedure Evaluation (Signed)
Anesthesia Post Note  Patient: Ryan Hernandez  Procedure(s) Performed: TOTAL KNEE ARTHROPLASTY (Right: Knee)     Patient location during evaluation: PACU Anesthesia Type: Regional and Spinal Level of consciousness: awake Pain management: pain level controlled Vital Signs Assessment: post-procedure vital signs reviewed and stable Respiratory status: spontaneous breathing, nonlabored ventilation and respiratory function stable Cardiovascular status: blood pressure returned to baseline and stable Postop Assessment: no apparent nausea or vomiting Anesthetic complications: no   No notable events documented.  Last Vitals:  Vitals:   03/09/22 1545 03/09/22 1608  BP: 128/82 121/75  Pulse: 61 (!) 57  Resp: 12 16  Temp:  36.7 C  SpO2: 95% 96%    Last Pain:  Vitals:   03/09/22 1608  TempSrc: Oral  PainSc: 4                  Madai Nuccio P Jamesyn Lindell

## 2022-03-09 NOTE — Op Note (Signed)
NAME: Ryan Hernandez, KOVACICH MEDICAL RECORD NO: [id number] ACCOUNT NO: [id number] DATE OF BIRTH: 20-Feb-1946 FACILITY: [facility] PHYSICIAN: Almedia Balls. Ranell Patrick, MD  Operative Report   DATE OF PROCEDURE: 03/09/2022  PREOPERATIVE DIAGNOSIS:  Right knee end-stage arthritis.  POSTOPERATIVE DIAGNOSIS:  Right knee end-stage arthritis.  PROCEDURE PERFORMED:  Right total knee arthroplasty using DePuy Attune prosthesis.  ATTENDING SURGEON:  Almedia Balls. Ranell Patrick, MD  ASSISTANT:  Konrad Felix Dixon, [place], who was scrubbed during the entire procedure and necessary for satisfactory completion of surgery.  ANESTHESIA:  Spinal anesthesia plus adductor canal block was utilized.  ESTIMATED BLOOD LOSS:  Minimal.  FLUID REPLACEMENT:  1500 mL crystalloid.  COUNTS:  Instrument counts correct.  COMPLICATIONS:  No complications.  ANTIBIOTICS:  Perioperative antibiotics were given.  INDICATIONS:  The patient is a 76 year old male who presents with a history of worsening right knee pain secondary to end-stage arthritis.  The patient has had a failure of conservative management over an extended period of time and presents for  operative total knee arthroplasty to eliminate pain and to restore function.  Informed consent obtained.  DESCRIPTION OF PROCEDURE:  After an adequate level of anesthesia was achieved, the patient was positioned supine on the operating room table.  Right leg correctly identified.  Nonsterile tourniquet placed on proximal thigh.  Right leg sterilely prepped  and draped in the usual manner.  Timeout called, verifying correct patient, correct site. We elevated the leg, exsanguinated with an Esmarch bandage and inflated the tourniquet to 300 mmHg.  We then placed the knee in flexion.  Performed a longitudinal  midline incision with a 10 blade scalpel. A fresh 10 blade was used for the medial parapatellar arthrotomy.  We then divided lateral patellofemoral ligaments, everting the patella and  exposing the distal femur, which was devoid of cartilage.  We entered  the distal femur with a step cut drill.  We then placed our intramedullary guide and resected 11 mm off the distal femur set on 5 degrees of valgus for this patient with a flexion contracture.  Once we had our distal cut done, we sized our femur to a  size 7 anterior down, performed anterior, posterior and chamfer cuts with the 4-in-1 block with the oscillating saw.  We then removed ACL and PCL meniscal tissue, subluxing the tibia anteriorly and performing our tibial cut with the external jig set on 2  mm off the affected medial side, minimal posterior slope, 90-degree perpendicular cut to the long axis of the tibia.  We used the oscillating saw for the tibial cut.  Once we cut the tibia, we placed a lamina spreader, removed excess posterior femoral  condyle osteophytes and released the capsule.  We then injected the posterior capsule with combination of Marcaine, Exparel and saline.  Next, we checked our gaps, which were symmetric at 6 mm plus.  We then removed our tibial pins, finished our tibial  preparation with the modular drill and keel punch for the 7 tibia.  We then went to the box for the femur and did our box cut for 7 right femur.  We then placed our trial femur in place and drilled our lug holes and then placed first a 6 and then 8 mm  poly spacer trial on the tibia and reduced the knee. We felt like we could get full extension with the 8 mm and had excellent flexion stability.  We then went to the patella and resurfaced it going from a 28 mm thickness down  to an 18 mm thickness.  We  drilled lug holes for the 38 patellar button and then trialed the knee with the 38 in place. We had excellent patellar tracking with no-touch technique.  We removed all trial components and irrigated thoroughly.  We then dried the bone and vacuum mixed  high viscosity cement on the back table.  We then cemented the components into place tibia,  femur, and the patella, all in one step, placing the knee in extension for compression with the 8 mm spacer and using a patellar clamp.  Once the cement was  hardened on the back table, we removed excess cement with quarter-inch curved osteotome and a Therapist, nutritional.  Once we had all the cement removed the excess cement, we placed a real size 10 mm poly on the tibia and reduced the knee.  We had nice little  pop as the medial side reduced.  Appropriate stability in flexion and extension and the ability to get the full extension.  We irrigated thoroughly and then we injected the anterior capsule with combination of Marcaine, Exparel and saline.  We then  closed the parapatellar arthrotomy with #1 Vicryl suture, followed by 0 and 2-0 layered subcutaneous closure and 4-0 Monocryl for skin.  Steri-Strips applied followed by sterile dressing.  The patient tolerated surgery well.   VAI D: 03/09/2022 1:46:21 pm T: 03/09/2022 11:03:00 pm  JOB: [id number]/ [id number]

## 2022-03-09 NOTE — Progress Notes (Signed)
Orthopedic Tech Progress Note Patient Details:  Ryan Hernandez 1946/01/17 [id number]  CPM Right Knee CPM Right Knee: On Right Knee Flexion (Degrees): 90 Right Knee Extension (Degrees): 0  Post Interventions Patient Tolerated: Well Ortho Devices Type of Ortho Device: Bone foam zero knee Ortho Device/Splint Location: Right knee Ortho Device/Splint Interventions: Application   Post Interventions Patient Tolerated: Well  Ryan Hernandez 03/09/2022, 2:40 PM

## 2022-03-09 NOTE — Evaluation (Signed)
Physical Therapy Evaluation Patient Details Name: Ryan Hernandez MRN: [id number] DOB: 1946/02/26 Today's Date: 03/09/2022  History of Present Illness  Pt is a 76yo male presenting s/p R-TKA on 03/09/22. PMH: hx of colon cancer, CAD, DM, GERD, HLD, HTN, PTSD, OSA on CPAP, L-TKA 2017   Clinical Impression  Ryan Hernandez is a 76 y.o. male POD 0 s/p R-TKA. Patient reports independence with mobility at baseline. Patient is now limited by functional impairments (see PT problem list below) and requires supervision for bed mobility and min assist for transfers. Patient was able to ambulate 68 feet with RW and min guard level of assist. Patient instructed in exercise to facilitate ROM and circulation to manage edema. Provided incentive spirometer and with Vcs pt able to achieve 1516m. Patient will benefit from continued skilled PT interventions to address impairments and progress towards PLOF. Acute PT will follow to progress mobility and stair training in preparation for safe discharge home.       Recommendations for follow up therapy are one component of a multi-disciplinary discharge planning process, led by the attending physician.  Recommendations may be updated based on patient status, additional functional criteria and insurance authorization.  Follow Up Recommendations Follow physician's recommendations for discharge plan and follow up therapies      Assistance Recommended at Discharge Frequent or constant Supervision/Assistance  Patient can return home with the following  A little help with walking and/or transfers;A little help with bathing/dressing/bathroom;Assistance with cooking/housework;Assist for transportation;Help with stairs or ramp for entrance    Equipment Recommendations Rolling walker (2 wheels)  Recommendations for Other Services       Functional Status Assessment Patient has had a recent decline in their functional status and demonstrates the ability to make significant  improvements in function in a reasonable and predictable amount of time.     Precautions / Restrictions Precautions Precautions: Fall;Knee Precaution Booklet Issued: No Precaution Comments: no pillow under the knee Restrictions Weight Bearing Restrictions: No Other Position/Activity Restrictions: wbat      Mobility  Bed Mobility Overal bed mobility: Needs Assistance Bed Mobility: Supine to Sit     Supine to sit: HOB elevated, Supervision     General bed mobility comments: for safety only    Transfers Overall transfer level: Needs assistance Equipment used: Rolling walker (2 wheels) Transfers: Sit to/from Stand Sit to Stand: Min assist           General transfer comment: Min assist for bringing weight forward, pt utilizing "rocking" back and forth method, VCs for sequencing and powering up from bed    Ambulation/Gait Ambulation/Gait assistance: Min guard, +2 safety/equipment Gait Distance (Feet): 68 Feet Assistive device: Rolling walker (2 wheels) Gait Pattern/deviations: Step-to pattern Gait velocity: decreased     General Gait Details: Pt ambulated with RW and min guard, +2 for recliner follow, no physical assist required or overt LOB noted.  Stairs            Wheelchair Mobility    Modified Rankin (Stroke Patients Only)       Balance Overall balance assessment: Needs assistance Sitting-balance support: Feet supported, No upper extremity supported Sitting balance-Leahy Scale: Good     Standing balance support: Reliant on assistive device for balance, During functional activity, Bilateral upper extremity supported Standing balance-Leahy Scale: Poor                               Pertinent Vitals/Pain Pain Assessment Pain Assessment:  0-10 Pain Score: 4  Pain Location: right knee Pain Descriptors / Indicators: Operative site guarding Pain Intervention(s): Limited activity within patient's tolerance, Monitored during session,  Repositioned, Ice applied    Home Living Family/patient expects to be discharged to:: Private residence Living Arrangements: Spouse/significant other Available Help at Discharge: Family;Available 24 hours/day Type of Home: House Home Access: Stairs to enter Entrance Stairs-Rails: None Entrance Stairs-Number of Steps: 1   Home Layout: One level Home Equipment: Shower seat;Cane - single point (shower seat about to fall apart)      Prior Function Prior Level of Function : Independent/Modified Independent;Driving             Mobility Comments: IND ADLs Comments: IND     Hand Dominance        Extremity/Trunk Assessment   Upper Extremity Assessment Upper Extremity Assessment: Overall WFL for tasks assessed    Lower Extremity Assessment Lower Extremity Assessment: RLE deficits/detail;LLE deficits/detail RLE Deficits / Details: MMT ank DF/PF 5/5, no extensor lag noted RLE Sensation: WNL LLE Deficits / Details: MMT ank DF/PF 5/5 LLE Sensation: WNL    Cervical / Trunk Assessment Cervical / Trunk Assessment: Kyphotic  Communication   Communication: HOH  Cognition Arousal/Alertness: Awake/alert Behavior During Therapy: WFL for tasks assessed/performed Overall Cognitive Status: Within Functional Limits for tasks assessed                                          General Comments      Exercises Total Joint Exercises Ankle Circles/Pumps: AROM, Both, 10 reps   Assessment/Plan    PT Assessment Patient needs continued PT services  PT Problem List Decreased strength;Decreased range of motion;Decreased activity tolerance;Decreased balance;Decreased mobility;Decreased coordination;Pain       PT Treatment Interventions DME instruction;Gait training;Stair training;Functional mobility training;Therapeutic activities;Therapeutic exercise;Balance training;Neuromuscular re-education;Patient/family education    PT Goals (Current goals can be found in the Care  Plan section)  Acute Rehab PT Goals Patient Stated Goal: go places and walk PT Goal Formulation: With patient Time For Goal Achievement: 03/16/22 Potential to Achieve Goals: Good    Frequency 7X/week     Co-evaluation               AM-PAC PT "6 Clicks" Mobility  Outcome Measure Help needed turning from your back to your side while in a flat bed without using bedrails?: None Help needed moving from lying on your back to sitting on the side of a flat bed without using bedrails?: A Little Help needed moving to and from a bed to a chair (including a wheelchair)?: A Little Help needed standing up from a chair using your arms (e.g., wheelchair or bedside chair)?: A Little Help needed to walk in hospital room?: A Little Help needed climbing 3-5 steps with a railing? : A Little 6 Click Score: 19    End of Session Equipment Utilized During Treatment: Gait belt Activity Tolerance: Patient tolerated treatment well;No increased pain Patient left: in chair;with call bell/phone within reach;with chair alarm set;with SCD's reapplied Nurse Communication: Mobility status PT Visit Diagnosis: Pain;Difficulty in walking, not elsewhere classified (R26.2) Pain - Right/Left: Right Pain - part of body: Knee    Time: 9767-3419 PT Time Calculation (min) (ACUTE ONLY): 19 min   Charges:   PT Evaluation $PT Eval Low Complexity: 1 Low          Coolidge Breeze, PT, DPT [facility]  Rehabilitation Department Office: [phone] Weekend pager: [phone]  Coolidge Breeze 03/09/2022, 5:39 PM

## 2022-03-09 NOTE — Anesthesia Procedure Notes (Signed)
Spinal  Patient location during procedure: OR Start time: 03/09/2022 11:55 AM End time: 03/09/2022 12:02 PM Reason for block: surgical anesthesia Staffing Performed: anesthesiologist  Anesthesiologist: Murvin Natal, MD Performed by: Murvin Natal, MD Authorized by: Murvin Natal, MD   Preanesthetic Checklist Completed: patient identified, IV checked, risks and benefits discussed, surgical consent, monitors and equipment checked, pre-op evaluation and timeout performed Spinal Block Patient position: sitting Prep: DuraPrep Patient monitoring: cardiac monitor, continuous pulse ox and blood pressure Approach: midline Location: L3-4 Injection technique: single-shot Needle Needle type: Quincke  Needle gauge: 22 G Needle length: 9 cm Assessment Sensory level: T10 Events: CSF return Additional Notes Functioning IV was confirmed and monitors were applied. Sterile prep and drape, including hand hygiene and sterile gloves were used. The patient was positioned and the spine was prepped. The skin was anesthetized with lidocaine.  Free flow of clear CSF was obtained after multiple attempts prior to injecting local anesthetic into the CSF.  The spinal needle aspirated freely following injection.  The needle was carefully withdrawn.  The patient tolerated the procedure well.

## 2022-03-09 NOTE — Anesthesia Procedure Notes (Signed)
Anesthesia Regional Block: Adductor canal block   Pre-Anesthetic Checklist: , timeout performed,  Correct Patient, Correct Site, Correct Laterality,  Correct Procedure,, site marked,  Risks and benefits discussed,  Surgical consent,  Pre-op evaluation,  At surgeon's request and post-op pain management  Laterality: Right  Prep: chloraprep       Needles:  Injection technique: Single-shot  Needle Type: Echogenic Stimulator Needle     Needle Length: 9cm  Needle Gauge: 21     Additional Needles:   Procedures:,,,, ultrasound used (permanent image in chart),,    Narrative:  Start time: 03/09/2022 10:30 AM End time: 03/09/2022 10:40 AM Injection made incrementally with aspirations every 5 mL.  Performed by: Personally  Anesthesiologist: Murvin Natal, MD  Additional Notes: Functioning IV was confirmed and monitors were applied. A time-out was performed. Hand hygiene and sterile gloves were used. The thigh was placed in a frog-leg position and prepped in a sterile fashion. A 62m 21ga Arrow echogenic stimulator needle was placed using ultrasound guidance.  Negative aspiration and negative test dose prior to incremental administration of local anesthetic. The patient tolerated the procedure well.

## 2022-03-09 NOTE — Interval H&P Note (Signed)
History and Physical Interval Note:  03/09/2022 11:40 AM  Ryan Hernandez  has presented today for surgery, with the diagnosis of right knee osteoarthritis.  The various methods of treatment have been discussed with the patient and family. After consideration of risks, benefits and other options for treatment, the patient has consented to  Procedure(s) with comments: TOTAL KNEE ARTHROPLASTY (Right) - 120 min choice and interscalene block as a surgical intervention.  The patient's history has been reviewed, patient examined, no change in status, stable for surgery.  I have reviewed the patient's chart and labs.  Questions were answered to the patient's satisfaction.     Augustin Schooling

## 2022-03-09 NOTE — Discharge Instructions (Signed)
Ice to the knee constantly.  Keep the incision covered and clean and dry for one week, then ok to get it wet in the shower.  Do exercise as instructed every hour, please to prevent stiffness.    DO NOT prop anything under the knee, it will make your knee stiff.  Prop under the ankle to encourage your knee to go straight.   Use the walker while you are up and around for balance.  Wear your support stockings 24/7 to prevent blood clots and take baby aspirin twice daily for 30 days also to prevent blood clots  Follow up with Dr Hudsyn Barich in two weeks in the office, call [phone] for appt   INSTRUCTIONS AFTER JOINT REPLACEMENT   Remove items at home which could result in a fall. This includes throw rugs or furniture in walking pathways ICE to the affected joint every three hours while awake for 30 minutes at a time, for at least the first 3-5 days, and then as needed for pain and swelling.  Continue to use ice for pain and swelling. You may notice swelling that will progress down to the foot and ankle.  This is normal after surgery.  Elevate your leg when you are not up walking on it.   Continue to use the breathing machine you got in the hospital (incentive spirometer) which will help keep your temperature down.  It is common for your temperature to cycle up and down following surgery, especially at night when you are not up moving around and exerting yourself.  The breathing machine keeps your lungs expanded and your temperature down.   DIET:  As you were doing prior to hospitalization, we recommend a well-balanced diet.  DRESSING / WOUND CARE / SHOWERING  You may change your dressing 3-5 days after surgery.  Then change the dressing every day with sterile gauze.  Please use good hand washing techniques before changing the dressing.  Do not use any lotions or creams on the incision until instructed by your surgeon.  ACTIVITY  Increase activity slowly as tolerated, but follow the weight  bearing instructions below.   No driving for 6 weeks or until further direction given by your physician.  You cannot drive while taking narcotics.  No lifting or carrying greater than 10 lbs. until further directed by your surgeon. Avoid periods of inactivity such as sitting longer than an hour when not asleep. This helps prevent blood clots.  You may return to work once you are authorized by your doctor.     WEIGHT BEARING   Weight bearing as tolerated with assist device (walker, cane, etc) as directed, use it as long as suggested by your surgeon or therapist, typically at least 4-6 weeks.   EXERCISES  Results after joint replacement surgery are often greatly improved when you follow the exercise, range of motion and muscle strengthening exercises prescribed by your doctor. Safety measures are also important to protect the joint from further injury. Any time any of these exercises cause you to have increased pain or swelling, decrease what you are doing until you are comfortable again and then slowly increase them. If you have problems or questions, call your caregiver or physical therapist for advice.   Rehabilitation is important following a joint replacement. After just a few days of immobilization, the muscles of the leg can become weakened and shrink (atrophy).  These exercises are designed to build up the tone and strength of the thigh and leg muscles and to   improve motion. Often times heat used for twenty to thirty minutes before working out will loosen up your tissues and help with improving the range of motion but do not use heat for the first two weeks following surgery (sometimes heat can increase post-operative swelling).   These exercises can be done on a training (exercise) mat, on the floor, on a table or on a bed. Use whatever works the best and is most comfortable for you.    Use music or television while you are exercising so that the exercises are a pleasant break in your day.  This will make your life better with the exercises acting as a break in your routine that you can look forward to.   Perform all exercises about fifteen times, three times per day or as directed.  You should exercise both the operative leg and the other leg as well.  Exercises include:   Quad Sets - Tighten up the muscle on the front of the thigh (Quad) and hold for 5-10 seconds.   Straight Leg Raises - With your knee straight (if you were given a brace, keep it on), lift the leg to 60 degrees, hold for 3 seconds, and slowly lower the leg.  Perform this exercise against resistance later as your leg gets stronger.  Leg Slides: Lying on your back, slowly slide your foot toward your buttocks, bending your knee up off the floor (only go as far as is comfortable). Then slowly slide your foot back down until your leg is flat on the floor again.  Angel Wings: Lying on your back spread your legs to the side as far apart as you can without causing discomfort.  Hamstring Strength:  Lying on your back, push your heel against the floor with your leg straight by tightening up the muscles of your buttocks.  Repeat, but this time bend your knee to a comfortable angle, and push your heel against the floor.  You may put a pillow under the heel to make it more comfortable if necessary.   A rehabilitation program following joint replacement surgery can speed recovery and prevent re-injury in the future due to weakened muscles. Contact your doctor or a physical therapist for more information on knee rehabilitation.    CONSTIPATION  Constipation is defined medically as fewer than three stools per week and severe constipation as less than one stool per week.  Even if you have a regular bowel pattern at home, your normal regimen is likely to be disrupted due to multiple reasons following surgery.  Combination of anesthesia, postoperative narcotics, change in appetite and fluid intake all can affect your bowels.   YOU MUST  use at least one of the following options; they are listed in order of increasing strength to get the job done.  They are all available over the counter, and you may need to use some, POSSIBLY even all of these options:    Drink plenty of fluids (prune juice may be helpful) and high fiber foods Colace 100 mg by mouth twice a day  Senokot for constipation as directed and as needed Dulcolax (bisacodyl), take with full glass of water  Miralax (polyethylene glycol) once or twice a day as needed.  If you have tried all these things and are unable to have a bowel movement in the first 3-4 days after surgery call either your surgeon or your primary doctor.    If you experience loose stools or diarrhea, hold the medications until you stool forms back   up.  If your symptoms do not get better within 1 week or if they get worse, check with your doctor.  If you experience "the worst abdominal pain ever" or develop nausea or vomiting, please contact the office immediately for further recommendations for treatment.   ITCHING:  If you experience itching with your medications, try taking only a single pain pill, or even half a pain pill at a time.  You can also use Benadryl over the counter for itching or also to help with sleep.   TED HOSE STOCKINGS:  Use stockings on both legs until for at least 2 weeks or as directed by physician office. They may be removed at night for sleeping.  MEDICATIONS:  See your medication summary on the "After Visit Summary" that nursing will review with you.  You may have some home medications which will be placed on hold until you complete the course of blood thinner medication.  It is important for you to complete the blood thinner medication as prescribed.  PRECAUTIONS:  If you experience chest pain or shortness of breath - call 911 immediately for transfer to the hospital emergency department.   If you develop a fever greater that 101 F, purulent drainage from wound, increased  redness or drainage from wound, foul odor from the wound/dressing, or calf pain - CONTACT YOUR SURGEON.                                                   FOLLOW-UP APPOINTMENTS:  If you do not already have a post-op appointment, please call the office for an appointment to be seen by your surgeon.  Guidelines for how soon to be seen are listed in your "After Visit Summary", but are typically between 1-4 weeks after surgery.  OTHER INSTRUCTIONS:   Knee Replacement:  Do not place pillow under knee, focus on keeping the knee straight while resting. CPM instructions: 0-90 degrees, 2 hours in the morning, 2 hours in the afternoon, and 2 hours in the evening. Place foam block, curve side up under heel at all times except when in CPM or when walking.  DO NOT modify, tear, cut, or change the foam block in any way.  POST-OPERATIVE OPIOID TAPER INSTRUCTIONS: It is important to wean off of your opioid medication as soon as possible. If you do not need pain medication after your surgery it is ok to stop day one. Opioids include: Codeine, Hydrocodone(Norco, Vicodin), Oxycodone(Percocet, oxycontin) and hydromorphone amongst others.  Long term and even short term use of opiods can cause: Increased pain response Dependence Constipation Depression Respiratory depression And more.  Withdrawal symptoms can include Flu like symptoms Nausea, vomiting And more Techniques to manage these symptoms Hydrate well Eat regular healthy meals Stay active Use relaxation techniques(deep breathing, meditating, yoga) Do Not substitute Alcohol to help with tapering If you have been on opioids for less than two weeks and do not have pain than it is ok to stop all together.  Plan to wean off of opioids This plan should start within one week post op of your joint replacement. Maintain the same interval or time between taking each dose and first decrease the dose.  Cut the total daily intake of opioids by one tablet each  day Next start to increase the time between doses. The last dose that should be eliminated is   the evening dose.   MAKE SURE YOU:  Understand these instructions.  Get help right away if you are not doing well or get worse.    Thank you for letting us be a part of your medical care team.  It is a privilege we respect greatly.  We hope these instructions will help you stay on track for a fast and full recovery!      

## 2022-03-09 NOTE — Brief Op Note (Signed)
03/09/2022  1:40 PM  PATIENT:  Ryan Hernandez  76 y.o. male  PRE-OPERATIVE DIAGNOSIS:  right knee osteoarthritis, end stage  POST-OPERATIVE DIAGNOSIS:  right knee osteoarthritis, end stage  PROCEDURE:  Procedure(s) with comments: TOTAL KNEE ARTHROPLASTY (Right) - 120 min choice and interscalene block DePuy Attune  SURGEON:  Surgeon(s) and Role:    Netta Cedars, MD - Primary  PHYSICIAN ASSISTANT:   ASSISTANTS: Ventura Bruns, PA-C   ANESTHESIA:   regional and spinal  EBL:  25 mL   BLOOD ADMINISTERED:none  DRAINS: none   LOCAL MEDICATIONS USED:  MARCAINE     SPECIMEN:  No Specimen  DISPOSITION OF SPECIMEN:  N/A  COUNTS:  YES  TOURNIQUET:  80 min  DICTATION: .Other Dictation: Dictation Number [id number]  PLAN OF CARE: Admit for overnight observation  PATIENT DISPOSITION:  PACU - hemodynamically stable.   Delay start of Pharmacological VTE agent (>24hrs) due to surgical blood loss or risk of bleeding: no

## 2022-03-10 DIAGNOSIS — M1711 Unilateral primary osteoarthritis, right knee: Secondary | ICD-10-CM | POA: Diagnosis not present

## 2022-03-10 LAB — CBC
HCT: 39 % (ref 39.0–52.0)
Hemoglobin: 13.4 g/dL (ref 13.0–17.0)
MCH: 31.7 pg (ref 26.0–34.0)
MCHC: 34.4 g/dL (ref 30.0–36.0)
MCV: 92.2 fL (ref 80.0–100.0)
Platelets: 211 10*3/uL (ref 150–400)
RBC: 4.23 MIL/uL (ref 4.22–5.81)
RDW: 12.5 % (ref 11.5–15.5)
WBC: 13.3 10*3/uL — ABNORMAL HIGH (ref 4.0–10.5)
nRBC: 0 % (ref 0.0–0.2)

## 2022-03-10 LAB — BASIC METABOLIC PANEL
Anion gap: 13 (ref 5–15)
BUN: 25 mg/dL — ABNORMAL HIGH (ref 8–23)
CO2: 24 mmol/L (ref 22–32)
Calcium: 8.9 mg/dL (ref 8.9–10.3)
Chloride: 101 mmol/L (ref 98–111)
Creatinine, Ser: 1.17 mg/dL (ref 0.61–1.24)
GFR, Estimated: >60 mL/min (ref >60)
Glucose, Bld: 143 mg/dL — ABNORMAL HIGH (ref 70–99)
Potassium: 3.1 mmol/L — ABNORMAL LOW (ref 3.5–5.1)
Sodium: 138 mmol/L (ref 135–145)

## 2022-03-10 LAB — GLUCOSE, CAPILLARY
Glucose-Capillary: 139 mg/dL — ABNORMAL HIGH (ref 70–99)
Glucose-Capillary: 191 mg/dL — ABNORMAL HIGH (ref 70–99)

## 2022-03-10 NOTE — Plan of Care (Signed)
Problem: Education: Goal: Ability to describe self-care measures that may prevent or decrease complications (Diabetes Survival Skills Education) will improve Outcome: Adequate for Discharge Goal: Individualized Educational Video(s) Outcome: Adequate for Discharge   Problem: Coping: Goal: Ability to adjust to condition or change in health will improve Outcome: Adequate for Discharge   Problem: Fluid Volume: Goal: Ability to maintain a balanced intake and output will improve Outcome: Adequate for Discharge   Problem: Health Behavior/Discharge Planning: Goal: Ability to identify and utilize available resources and services will improve Outcome: Adequate for Discharge Goal: Ability to manage health-related needs will improve Outcome: Adequate for Discharge   Problem: Metabolic: Goal: Ability to maintain appropriate glucose levels will improve Outcome: Adequate for Discharge   Problem: Nutritional: Goal: Maintenance of adequate nutrition will improve Outcome: Adequate for Discharge Goal: Progress toward achieving an optimal weight will improve Outcome: Adequate for Discharge   Problem: Skin Integrity: Goal: Risk for impaired skin integrity will decrease Outcome: Adequate for Discharge   Problem: Tissue Perfusion: Goal: Adequacy of tissue perfusion will improve Outcome: Adequate for Discharge   Problem: Education: Goal: Knowledge of the prescribed therapeutic regimen will improve Outcome: Adequate for Discharge Goal: Individualized Educational Video(s) Outcome: Adequate for Discharge   Problem: Activity: Goal: Ability to avoid complications of mobility impairment will improve Outcome: Adequate for Discharge Goal: Range of joint motion will improve Outcome: Adequate for Discharge   Problem: Clinical Measurements: Goal: Postoperative complications will be avoided or minimized Outcome: Adequate for Discharge   Problem: Pain Management: Goal: Pain level will decrease  with appropriate interventions Outcome: Adequate for Discharge   Problem: Skin Integrity: Goal: Will show signs of wound healing Outcome: Adequate for Discharge   Problem: Education: Goal: Knowledge of General Education information will improve Description: Including pain rating scale, medication(s)/side effects and non-pharmacologic comfort measures Outcome: Adequate for Discharge   Problem: Health Behavior/Discharge Planning: Goal: Ability to manage health-related needs will improve Outcome: Adequate for Discharge   Problem: Clinical Measurements: Goal: Ability to maintain clinical measurements within normal limits will improve Outcome: Adequate for Discharge Goal: Will remain free from infection Outcome: Adequate for Discharge Goal: Diagnostic test results will improve Outcome: Adequate for Discharge Goal: Respiratory complications will improve Outcome: Adequate for Discharge Goal: Cardiovascular complication will be avoided Outcome: Adequate for Discharge   Problem: Activity: Goal: Risk for activity intolerance will decrease Outcome: Adequate for Discharge   Problem: Nutrition: Goal: Adequate nutrition will be maintained Outcome: Adequate for Discharge   Problem: Coping: Goal: Level of anxiety will decrease Outcome: Adequate for Discharge   Problem: Elimination: Goal: Will not experience complications related to bowel motility Outcome: Adequate for Discharge Goal: Will not experience complications related to urinary retention Outcome: Adequate for Discharge   Problem: Pain Managment: Goal: General experience of comfort will improve Outcome: Adequate for Discharge   Problem: Safety: Goal: Ability to remain free from injury will improve Outcome: Adequate for Discharge   Problem: Skin Integrity: Goal: Risk for impaired skin integrity will decrease Outcome: Adequate for Discharge   Problem: Education: Goal: Knowledge of the prescribed therapeutic regimen  will improve Outcome: Adequate for Discharge Goal: Individualized Educational Video(s) Outcome: Adequate for Discharge   Problem: Activity: Goal: Ability to avoid complications of mobility impairment will improve Outcome: Adequate for Discharge Goal: Range of joint motion will improve Outcome: Adequate for Discharge   Problem: Clinical Measurements: Goal: Postoperative complications will be avoided or minimized Outcome: Adequate for Discharge   Problem: Pain Management: Goal: Pain level will decrease with appropriate  interventions Outcome: Adequate for Discharge   Problem: Skin Integrity: Goal: Will show signs of wound healing Outcome: Adequate for Discharge   Problem: Education: Goal: Knowledge of General Education information will improve Description: Including pain rating scale, medication(s)/side effects and non-pharmacologic comfort measures Outcome: Adequate for Discharge   Problem: Health Behavior/Discharge Planning: Goal: Ability to manage health-related needs will improve Outcome: Adequate for Discharge   Problem: Clinical Measurements: Goal: Ability to maintain clinical measurements within normal limits will improve Outcome: Adequate for Discharge Goal: Will remain free from infection Outcome: Adequate for Discharge Goal: Diagnostic test results will improve Outcome: Adequate for Discharge Goal: Respiratory complications will improve Outcome: Adequate for Discharge Goal: Cardiovascular complication will be avoided Outcome: Adequate for Discharge  Discharge patient with family.  Written instructions given.

## 2022-03-10 NOTE — Plan of Care (Signed)
Problem: Education: Goal: Ability to describe self-care measures that may prevent or decrease complications (Diabetes Survival Skills Education) will improve Outcome: Progressing Goal: Individualized Educational Video(s) Outcome: Progressing   Problem: Coping: Goal: Ability to adjust to condition or change in health will improve Outcome: Progressing   Problem: Fluid Volume: Goal: Ability to maintain a balanced intake and output will improve Outcome: Progressing   Problem: Health Behavior/Discharge Planning: Goal: Ability to identify and utilize available resources and services will improve Outcome: Progressing Goal: Ability to manage health-related needs will improve Outcome: Progressing   Problem: Metabolic: Goal: Ability to maintain appropriate glucose levels will improve Outcome: Progressing   Problem: Nutritional: Goal: Maintenance of adequate nutrition will improve Outcome: Progressing Goal: Progress toward achieving an optimal weight will improve Outcome: Progressing   Problem: Skin Integrity: Goal: Risk for impaired skin integrity will decrease Outcome: Progressing   Problem: Tissue Perfusion: Goal: Adequacy of tissue perfusion will improve Outcome: Progressing   Problem: Education: Goal: Knowledge of the prescribed therapeutic regimen will improve Outcome: Progressing Goal: Individualized Educational Video(s) Outcome: Progressing   Problem: Activity: Goal: Ability to avoid complications of mobility impairment will improve Outcome: Progressing Goal: Range of joint motion will improve Outcome: Progressing   Problem: Clinical Measurements: Goal: Postoperative complications will be avoided or minimized Outcome: Progressing   Problem: Pain Management: Goal: Pain level will decrease with appropriate interventions Outcome: Progressing   Problem: Skin Integrity: Goal: Will show signs of wound healing Outcome: Progressing   Problem: Education: Goal:  Knowledge of the prescribed therapeutic regimen will improve Outcome: Progressing Goal: Individualized Educational Video(s) Outcome: Progressing   Problem: Activity: Goal: Ability to avoid complications of mobility impairment will improve Outcome: Progressing Goal: Range of joint motion will improve Outcome: Progressing   Problem: Clinical Measurements: Goal: Postoperative complications will be avoided or minimized Outcome: Progressing   Problem: Pain Management: Goal: Pain level will decrease with appropriate interventions Outcome: Progressing   Problem: Skin Integrity: Goal: Will show signs of wound healing Outcome: Progressing   Problem: Education: Goal: Knowledge of General Education information will improve Description: Including pain rating scale, medication(s)/side effects and non-pharmacologic comfort measures Outcome: Progressing   Problem: Health Behavior/Discharge Planning: Goal: Ability to manage health-related needs will improve Outcome: Progressing   Problem: Clinical Measurements: Goal: Ability to maintain clinical measurements within normal limits will improve Outcome: Progressing Goal: Will remain free from infection Outcome: Progressing Goal: Diagnostic test results will improve Outcome: Progressing Goal: Respiratory complications will improve Outcome: Progressing Goal: Cardiovascular complication will be avoided Outcome: Progressing   Problem: Activity: Goal: Risk for activity intolerance will decrease Outcome: Progressing   Problem: Nutrition: Goal: Adequate nutrition will be maintained Outcome: Progressing   Problem: Coping: Goal: Level of anxiety will decrease Outcome: Progressing   Problem: Elimination: Goal: Will not experience complications related to bowel motility Outcome: Progressing Goal: Will not experience complications related to urinary retention Outcome: Progressing   Problem: Pain Managment: Goal: General experience of  comfort will improve Outcome: Progressing   Problem: Safety: Goal: Ability to remain free from injury will improve Outcome: Progressing   Problem: Skin Integrity: Goal: Risk for impaired skin integrity will decrease Outcome: Progressing   Problem: Education: Goal: Knowledge of General Education information will improve Description: Including pain rating scale, medication(s)/side effects and non-pharmacologic comfort measures Outcome: Progressing   Problem: Health Behavior/Discharge Planning: Goal: Ability to manage health-related needs will improve Outcome: Progressing   Problem: Clinical Measurements: Goal: Ability to maintain clinical measurements within normal limits will improve Outcome: Progressing  Goal: Will remain free from infection Outcome: Progressing Goal: Diagnostic test results will improve Outcome: Progressing Goal: Respiratory complications will improve Outcome: Progressing Goal: Cardiovascular complication will be avoided Outcome: Progressing   Problem: Activity: Goal: Risk for activity intolerance will decrease Outcome: Progressing   Problem: Nutrition: Goal: Adequate nutrition will be maintained Outcome: Progressing   Problem: Coping: Goal: Level of anxiety will decrease Outcome: Progressing   Problem: Elimination: Goal: Will not experience complications related to bowel motility Outcome: Progressing Goal: Will not experience complications related to urinary retention Outcome: Progressing   Problem: Pain Managment: Goal: General experience of comfort will improve Outcome: Progressing   Problem: Safety: Goal: Ability to remain free from injury will improve Outcome: Progressing   Problem: Skin Integrity: Goal: Risk for impaired skin integrity will decrease Outcome: Progressing

## 2022-03-10 NOTE — TOC Initial Note (Incomplete)
Transition of Care [facility]) - Initial/Assessment Note    Patient Details  Name: Ryan Hernandez MRN: [id number] Date of Birth: [birth year]  [facility] CM/SW Contact:    Illene Regulus, LCSW Phone Number: 03/10/2022, 9:23 AM  Clinical Narrative:                 CSW sent referral for rolling walker   Expected Discharge Plan: Home/Self Care Barriers to Discharge: Barriers Resolved   Patient Goals and CMS Choice Patient states their goals for this hospitalization and ongoing recovery are:: retun home CMS Medicare.gov Compare Post Acute Care list provided to:: Patient Choice offered to / list presented to : Patient  Expected Discharge Plan and Services Expected Discharge Plan: Home/Self Care In-house Referral: Clinical Social Work       Expected Discharge Date: 03/10/22               DME Arranged: Gilford Rile rolling with seat DME Agency: [employer] Date DME Agency Contacted: 03/10/22 Time DME Agency Contacted: [phone] Representative spoke with at DME Agency: kristin            Prior Living Arrangements/Services   Lives with:: Self Patient language and need for interpreter reviewed:: Yes Do you feel safe going back to the place where you live?: Yes      Need for Family Participation in Patient Care: No (Comment) Care giver support system in place?: No (comment)   Criminal Activity/Legal Involvement Pertinent to Current Situation/Hospitalization: No - Comment as needed  Activities of Daily Living Home Assistive Devices/Equipment: Eyeglasses ADL Screening (condition at time of admission) Patient's cognitive ability adequate to safely complete daily activities?: Yes Is the patient deaf or have difficulty hearing?: Yes Does the patient have difficulty seeing, even when wearing glasses/contacts?: No Does the patient have difficulty concentrating, remembering, or making decisions?: No Patient able to express need for assistance with ADLs?: Yes Does the  patient have difficulty dressing or bathing?: No Independently performs ADLs?: Yes (appropriate for developmental age) Does the patient have difficulty walking or climbing stairs?: Yes Weakness of Legs: Right Weakness of Arms/Hands: None  Permission Sought/Granted                  Emotional Assessment Appearance:: Appears stated age Attitude/Demeanor/Rapport: Gracious Affect (typically observed): Accepting Orientation: : Oriented to Self, Oriented to Place, Oriented to  Time, Oriented to Situation   Psych Involvement: No (comment)  Admission diagnosis:  Status post total knee replacement, right [Z96.651] Patient Active Problem List   Diagnosis Date Noted   Status post total knee replacement, right 03/09/2022   Hyperglycemia 03/10/2014   Obesity (BMI 30-39.9)    Hyperlipidemia 03/04/2012   Hypertension 03/04/2012   CAD (coronary artery disease) 03/04/2012   OSA on CPAP 03/04/2012   Anxiety 03/04/2012   History of colon cancer    PCP:  Clinic, [place]:   [place] ([place]), [place] - [address] [place] [address] Phone: [phone] Fax: [phone]  [id number] [place] [address]: [phone]: [address] [address] [address]: [phone]: [phone]    Social Determinants of Health (SDOH) Interventions    Readmission Risk Interventions     No data to display

## 2022-03-10 NOTE — Discharge Summary (Signed)
In most cases prophylactic antibiotics for Dental procdeures after total joint surgery are not necessary.  Exceptions are as follows:  1. History of prior total joint infection  2. Severely immunocompromised (Organ Transplant, cancer chemotherapy, Rheumatoid biologic meds such as [place])  3. Poorly controlled diabetes (A1C &gt; 8.0, blood glucose over 200)  If you have one of these conditions, contact your surgeon for an antibiotic prescription, prior to your dental procedure. Orthopedic Discharge Summary        Physician Discharge Summary  Patient ID: Ryan Hernandez MRN: [id number] DOB/AGE: 01-18-1946 76 y.o.  Admit date: 03/09/2022 Discharge date: 03/10/2022   Procedures:  Procedure(s) (LRB): TOTAL KNEE ARTHROPLASTY (Right)  Attending Physician:  Dr. Esmond Plants  Admission Diagnoses:   Right knee end stage OA  Discharge Diagnoses:  same   Past Medical History:  Diagnosis Date   Allergy    Anemia    Anxiety    Arthritis    Blood transfusion 2002   Cancer [facility]) 2002   colon   Coronary artery disease    Depression    Diabetes mellitus without complication ([place])    Fatty tumor    under arms, back,legs, chest   GERD (gastroesophageal reflux disease)    hx of, no medications currently   Heart aneurysm    Aortic aneurysm/   Hyperlipidemia    Hypertension    Pneumonia    PTSD (post-traumatic stress disorder)    Sleep apnea    nightly c-pap    PCP: [facility]   Discharged Condition: good  Hospital Course:  Patient underwent the above stated procedure on 03/09/2022. Patient tolerated the procedure well and brought to the recovery room in good condition and subsequently to the floor. Patient had an uncomplicated hospital course and was stable for discharge.   Disposition: Discharge disposition: 01-Home or Self Care      with follow up in 2 weeks    Follow-up Information     Netta Cedars, MD. Call in 2 week(s).   Specialty:  Orthopedic Surgery Why: call [phone] for appt in two weeks in the office Contact information: [address] [place] [address] [phone]                 Dental Antibiotics:  In most cases prophylactic antibiotics for Dental procdeures after total joint surgery are not necessary.  Exceptions are as follows:  1. History of prior total joint infection  2. Severely immunocompromised (Organ Transplant, cancer chemotherapy, Rheumatoid biologic meds such as [place])  3. Poorly controlled diabetes (A1C &gt; 8.0, blood glucose over 200)  If you have one of these conditions, contact your surgeon for an antibiotic prescription, prior to your dental procedure.  Discharge Instructions     Call MD / Call 911   Complete by: As directed    If you experience chest pain or shortness of breath, CALL 911 and be transported to the hospital emergency room.  If you develope a fever above 101 F, pus (white drainage) or increased drainage or redness at the wound, or calf pain, call your surgeon's office.   Constipation Prevention   Complete by: As directed    Drink plenty of fluids.  Prune juice may be helpful.  You may use a stool softener, such as Colace (over the counter) 100 mg twice a day.  Use MiraLax (over the counter) for constipation as needed.   Diet - low sodium heart healthy   Complete by: As directed  Increase activity slowly as tolerated   Complete by: As directed    Post-operative opioid taper instructions:   Complete by: As directed    POST-OPERATIVE OPIOID TAPER INSTRUCTIONS: It is important to wean off of your opioid medication as soon as possible. If you do not need pain medication after your surgery it is ok to stop day one. Opioids include: Codeine, Hydrocodone(Norco, Vicodin), Oxycodone(Percocet, oxycontin) and hydromorphone amongst others.  Long term and even short term use of opiods can cause: Increased pain  response Dependence Constipation Depression Respiratory depression And more.  Withdrawal symptoms can include Flu like symptoms Nausea, vomiting And more Techniques to manage these symptoms Hydrate well Eat regular healthy meals Stay active Use relaxation techniques(deep breathing, meditating, yoga) Do Not substitute Alcohol to help with tapering If you have been on opioids for less than two weeks and do not have pain than it is ok to stop all together.  Plan to wean off of opioids This plan should start within one week post op of your joint replacement. Maintain the same interval or time between taking each dose and first decrease the dose.  Cut the total daily intake of opioids by one tablet each day Next start to increase the time between doses. The last dose that should be eliminated is the evening dose.          Allergies as of 03/10/2022       Reactions   Ace Inhibitors Cough        Medication List     TAKE these medications    Advil PM 200-25 MG Caps Generic drug: Ibuprofen-diphenhydrAMINE HCl Take 3 tablets by mouth at bedtime.   amLODipine 10 MG tablet Commonly known as: NORVASC Take 1 tablet (10 mg total) by mouth daily.   aspirin EC 81 MG tablet Take 1 tablet (81 mg total) by mouth 2 (two) times daily. What changed: when to take this   atorvastatin 40 MG tablet Commonly known as: LIPITOR Take 1 tablet (40 mg total) by mouth at bedtime.   Cholecalciferol 250 MCG (10000 UT) Caps Take 1,000 Units by mouth at bedtime.   empagliflozin 25 MG Tabs tablet Commonly known as: JARDIANCE Take 25 mg by mouth in the morning.   finasteride 5 MG tablet Commonly known as: PROSCAR Take 5 mg by mouth at bedtime.   hydrochlorothiazide 25 MG tablet Commonly known as: HYDRODIURIL Take 25 mg by mouth at bedtime.   losartan 100 MG tablet Commonly known as: COZAAR Take 100 mg by mouth in the morning.   metFORMIN 500 MG 24 hr tablet Commonly known as:  GLUCOPHAGE-XR Take 1,000 mg by mouth in the morning and at bedtime.   methocarbamol 500 MG tablet Commonly known as: ROBAXIN Take 1 tablet (500 mg total) by mouth every 8 (eight) hours as needed for muscle spasms.   metoprolol succinate 50 MG 24 hr tablet Commonly known as: TOPROL-XL Take 25 mg by mouth in the morning. Take with or immediately following a meal.   mirtazapine 30 MG tablet Commonly known as: REMERON Take 30 mg by mouth at bedtime.   nitroGLYCERIN 0.4 MG SL tablet Commonly known as: NITROSTAT Place 1 tablet (0.4 mg total) under the tongue every 5 (five) minutes as needed.   ondansetron 4 MG tablet Commonly known as: Zofran Take 1 tablet (4 mg total) by mouth every 8 (eight) hours as needed for refractory nausea / vomiting, vomiting or nausea.   oxyCODONE-acetaminophen 5-325 MG tablet Commonly known as: Percocet  Take 1 tablet by mouth every 6 (six) hours as needed for severe pain or moderate pain.   potassium chloride 10 MEQ tablet Commonly known as: KLOR-CON Take 10-20 mEq by mouth See admin instructions. Take 1 tablet (10 meq) by mouth at night & take 2 tablets (20 meq) by mouth in the morning.   tamsulosin 0.4 MG Caps capsule Commonly known as: FLOMAX Take 0.8 mg by mouth at bedtime.   traZODone 100 MG tablet Commonly known as: DESYREL Take 100 mg by mouth at bedtime.          Signed: Augustin Schooling 03/10/2022, 9:10 AM  [facility] is now [employer] [address], [place], [place], [place] [address] Phone: [place]

## 2022-03-10 NOTE — Progress Notes (Signed)
Orthopedics Progress Note  Subjective: Comfortable this AM  Objective:  Vitals:   03/10/22 0144 03/10/22 0503  BP: 125/72 115/71  Pulse: 64 (!) 59  Resp: 16 16  Temp: 97.9 F (36.6 C) 98.6 F (37 C)  SpO2: 97% 98%    General: Awake and alert  Musculoskeletal: Right knee incision CDI, aquacel placed Neurovascularly intact  Lab Results  Component Value Date   WBC 13.3 (H) 03/10/2022   HGB 13.4 03/10/2022   HCT 39.0 03/10/2022   MCV 92.2 03/10/2022   PLT 211 03/10/2022       Component Value Date/Time   NA 138 03/10/2022 0359   K 3.1 (L) 03/10/2022 0359   CL 101 03/10/2022 0359   CO2 24 03/10/2022 0359   GLUCOSE 143 (H) 03/10/2022 0359   BUN 25 (H) 03/10/2022 0359   CREATININE 1.17 03/10/2022 0359   CREATININE 1.02 06/08/2015 1644   CALCIUM 8.9 03/10/2022 0359   GFRNONAA >60 03/10/2022 0359   GFRNONAA 74 06/08/2015 1644   GFRAA 86 06/08/2015 1644    No results found for: "INR", "PROTIME"  Assessment/Plan: POD #1 s/p Procedure(s): TOTAL KNEE ARTHROPLASTY Stable this AM. Home after therapy F/u Dec 28  Remo Lipps R. Veverly Fells, MD 03/10/2022 9:08 AM

## 2022-03-10 NOTE — Progress Notes (Signed)
Physical Therapy Treatment Patient Details Name: Ryan Hernandez MRN: [id number] DOB: 21-Apr-1945 Today's Date: 03/10/2022   History of Present Illness Pt is a 76yo male presenting s/p R-TKA on 03/09/22. PMH: hx of colon cancer, CAD, DM, GERD, HLD, HTN, PTSD, OSA on CPAP, L-TKA 2017    PT Comments    Progressing well with mobility. Reviewed/practiced exercises, gait training, and stair training. Encouraged pt to ambulate often at home, as tolerated and to work on seated knee flexion ROM exercise. All PT education completed.     Recommendations for follow up therapy are one component of a multi-disciplinary discharge planning process, led by the attending physician.  Recommendations may be updated based on patient status, additional functional criteria and insurance authorization.  Follow Up Recommendations  Follow physician's recommendations for discharge plan and follow up therapies     Assistance Recommended at Discharge Frequent or constant Supervision/Assistance  Patient can return home with the following A little help with walking and/or transfers;A little help with bathing/dressing/bathroom;Assistance with cooking/housework;Assist for transportation;Help with stairs or ramp for entrance   Equipment Recommendations  Rolling walker (2 wheels)    Recommendations for Other Services       Precautions / Restrictions Precautions Precautions: Fall;Knee Precaution Comments: no pillow under the knee Restrictions Weight Bearing Restrictions: No Other Position/Activity Restrictions: wbat     Mobility  Bed Mobility Overal bed mobility: Needs Assistance Bed Mobility: Supine to Sit     Supine to sit: HOB elevated, Supervision          Transfers Overall transfer level: Needs assistance Equipment used: Rolling walker (2 wheels) Transfers: Sit to/from Stand Sit to Stand: Min guard           General transfer comment: Min guard for safety. Increased time. Cues for safety.     Ambulation/Gait Ambulation/Gait assistance: Min guard Gait Distance (Feet): 85 Feet Assistive device: Rolling walker (2 wheels) Gait Pattern/deviations: Step-through pattern, Decreased stride length       General Gait Details: Min guard for safety. No LOB with RW use. Pt tolerated distance well.   Stairs Stairs:  (verbally reviewed technique/sequence for small threshold step)           Wheelchair Mobility    Modified Rankin (Stroke Patients Only)       Balance Overall balance assessment: Needs assistance         Standing balance support: Reliant on assistive device for balance, During functional activity, Bilateral upper extremity supported Standing balance-Leahy Scale: Fair                              Cognition Arousal/Alertness: Awake/alert Behavior During Therapy: WFL for tasks assessed/performed Overall Cognitive Status: Within Functional Limits for tasks assessed                                          Exercises Total Joint Exercises Ankle Circles/Pumps: AROM, Both, 10 reps Quad Sets: AROM, 10 reps Hip ABduction/ADduction: AROM, Right, 10 reps Straight Leg Raises: AROM, Right, 10 reps, AAROM Knee Flexion: AROM, Right, 10 reps, Seated Goniometric ROM: ~10-70 degrees    General Comments        Pertinent Vitals/Pain Pain Assessment Pain Assessment: 0-10 Pain Location: right knee Pain Descriptors / Indicators: Aching, Discomfort, Sore Pain Intervention(s): Limited activity within patient's tolerance, Monitored during session, Ice applied, Repositioned  Home Living                          Prior Function            PT Goals (current goals can now be found in the care plan section) Progress towards PT goals: Progressing toward goals    Frequency    7X/week      PT Plan Current plan remains appropriate    Co-evaluation              AM-PAC PT "6 Clicks" Mobility   Outcome Measure   Help needed turning from your back to your side while in a flat bed without using bedrails?: None Help needed moving from lying on your back to sitting on the side of a flat bed without using bedrails?: None Help needed moving to and from a bed to a chair (including a wheelchair)?: A Little Help needed standing up from a chair using your arms (e.g., wheelchair or bedside chair)?: A Little Help needed to walk in hospital room?: A Little Help needed climbing 3-5 steps with a railing? : A Little 6 Click Score: 20    End of Session Equipment Utilized During Treatment: Gait belt Activity Tolerance: Patient tolerated treatment well Patient left: in chair;with call bell/phone within reach;with family/visitor present   PT Visit Diagnosis: Pain;Difficulty in walking, not elsewhere classified (R26.2) Pain - Right/Left: Right Pain - part of body: Knee     Time: 3557-3220 PT Time Calculation (min) (ACUTE ONLY): 27 min  Charges:  $Gait Training: 8-22 mins $Therapeutic Exercise: 8-22 mins                        Doreatha Massed, PT Acute Rehabilitation  Office: [phone]

## 2022-03-12 ENCOUNTER — Encounter (HOSPITAL_COMMUNITY): Payer: Self-pay | Admitting: Orthopedic Surgery

## 2022-03-13 DIAGNOSIS — M25661 Stiffness of right knee, not elsewhere classified: Secondary | ICD-10-CM | POA: Insufficient documentation

## 2022-03-13 DIAGNOSIS — M25561 Pain in right knee: Secondary | ICD-10-CM | POA: Insufficient documentation

## 2022-06-13 ENCOUNTER — Encounter: Payer: Self-pay | Admitting: Internal Medicine

## 2022-07-10 DIAGNOSIS — I358 Other nonrheumatic aortic valve disorders: Secondary | ICD-10-CM | POA: Insufficient documentation

## 2022-07-18 ENCOUNTER — Ambulatory Visit (AMBULATORY_SURGERY_CENTER): Payer: Medicare Other

## 2022-07-18 VITALS — Ht 70.0 in | Wt 223.0 lb

## 2022-07-18 DIAGNOSIS — Z789 Other specified health status: Secondary | ICD-10-CM | POA: Insufficient documentation

## 2022-07-18 DIAGNOSIS — E876 Hypokalemia: Secondary | ICD-10-CM | POA: Insufficient documentation

## 2022-07-18 DIAGNOSIS — T8484XA Pain due to internal orthopedic prosthetic devices, implants and grafts, initial encounter: Secondary | ICD-10-CM | POA: Insufficient documentation

## 2022-07-18 DIAGNOSIS — H35033 Hypertensive retinopathy, bilateral: Secondary | ICD-10-CM | POA: Insufficient documentation

## 2022-07-18 DIAGNOSIS — B351 Tinea unguium: Secondary | ICD-10-CM | POA: Insufficient documentation

## 2022-07-18 DIAGNOSIS — E669 Obesity, unspecified: Secondary | ICD-10-CM | POA: Insufficient documentation

## 2022-07-18 DIAGNOSIS — Z86010 Personal history of colonic polyps: Secondary | ICD-10-CM

## 2022-07-18 DIAGNOSIS — M1711 Unilateral primary osteoarthritis, right knee: Secondary | ICD-10-CM | POA: Insufficient documentation

## 2022-07-18 DIAGNOSIS — Z712 Person consulting for explanation of examination or test findings: Secondary | ICD-10-CM | POA: Insufficient documentation

## 2022-07-18 DIAGNOSIS — F17201 Nicotine dependence, unspecified, in remission: Secondary | ICD-10-CM | POA: Insufficient documentation

## 2022-07-18 DIAGNOSIS — H35373 Puckering of macula, bilateral: Secondary | ICD-10-CM | POA: Insufficient documentation

## 2022-07-18 DIAGNOSIS — N529 Male erectile dysfunction, unspecified: Secondary | ICD-10-CM | POA: Insufficient documentation

## 2022-07-18 DIAGNOSIS — N183 Chronic kidney disease, stage 3 unspecified: Secondary | ICD-10-CM | POA: Insufficient documentation

## 2022-07-18 DIAGNOSIS — H903 Sensorineural hearing loss, bilateral: Secondary | ICD-10-CM | POA: Insufficient documentation

## 2022-07-18 DIAGNOSIS — F4312 Post-traumatic stress disorder, chronic: Secondary | ICD-10-CM | POA: Insufficient documentation

## 2022-07-18 DIAGNOSIS — Z23 Encounter for immunization: Secondary | ICD-10-CM | POA: Insufficient documentation

## 2022-07-18 DIAGNOSIS — Z7729 Contact with and (suspected ) exposure to other hazardous substances: Secondary | ICD-10-CM | POA: Insufficient documentation

## 2022-07-18 DIAGNOSIS — N400 Enlarged prostate without lower urinary tract symptoms: Secondary | ICD-10-CM | POA: Insufficient documentation

## 2022-07-18 DIAGNOSIS — I517 Cardiomegaly: Secondary | ICD-10-CM | POA: Insufficient documentation

## 2022-07-18 DIAGNOSIS — Z9182 Personal history of military deployment: Secondary | ICD-10-CM | POA: Insufficient documentation

## 2022-07-18 DIAGNOSIS — M25561 Pain in right knee: Secondary | ICD-10-CM | POA: Insufficient documentation

## 2022-07-18 DIAGNOSIS — Z85038 Personal history of other malignant neoplasm of large intestine: Secondary | ICD-10-CM

## 2022-07-18 DIAGNOSIS — Z461 Encounter for fitting and adjustment of hearing aid: Secondary | ICD-10-CM | POA: Insufficient documentation

## 2022-07-18 MED ORDER — NA SULFATE-K SULFATE-MG SULF 17.5-3.13-1.6 GM/177ML PO SOLN: 1.0000 | Freq: Once | ORAL | 0 refills | Status: AC

## 2022-07-18 NOTE — Progress Notes (Signed)
No egg or soy allergy known to patient  No issues known to pt with past sedation with any surgeries or procedures Patient denies ever being told they had issues or difficulty with intubation  No FH of Malignant Hyperthermia Pt is not on diet pills Pt is not on  home 02  Pt is not on blood thinners  Pt denies issues with constipation  No A fib or A flutter Have any cardiac testing pending--no  Pt instructed to use Single[employer]com or GoodRx for a price reduction on prep   Pt reports full mobility  Patient's chart reviewed by Cathlyn Parsons CNRA prior to previsit and patient appropriate for the [facility].  Previsit completed and red dot placed by patient's name on their procedure day (on provider's schedule).

## 2022-07-27 ENCOUNTER — Telehealth: Payer: Self-pay | Admitting: Internal Medicine

## 2022-07-27 ENCOUNTER — Other Ambulatory Visit: Payer: Self-pay

## 2022-07-27 DIAGNOSIS — Z86010 Personal history of colonic polyps: Secondary | ICD-10-CM

## 2022-07-27 DIAGNOSIS — Z85038 Personal history of other malignant neoplasm of large intestine: Secondary | ICD-10-CM

## 2022-07-27 MED ORDER — NA SULFATE-K SULFATE-MG SULF 17.5-3.13-1.6 GM/177ML PO SOLN: 1.0000 | Freq: Once | ORAL | 0 refills | Status: AC

## 2022-07-27 NOTE — Telephone Encounter (Signed)
Patient called said he still does not have his prep medication requested for it to be sent to [facility] on [address] he is there now.

## 2022-07-27 NOTE — Telephone Encounter (Signed)
New rx sent pt made aware  

## 2022-08-10 ENCOUNTER — Encounter: Payer: Self-pay | Admitting: Internal Medicine

## 2022-08-10 ENCOUNTER — Ambulatory Visit (AMBULATORY_SURGERY_CENTER): Payer: Medicare Other | Admitting: Internal Medicine

## 2022-08-10 VITALS — BP 120/77 | HR 52 | Temp 97.1°F | Resp 15 | Ht 70.0 in | Wt 223.0 lb

## 2022-08-10 DIAGNOSIS — D128 Benign neoplasm of rectum: Secondary | ICD-10-CM | POA: Diagnosis not present

## 2022-08-10 DIAGNOSIS — D125 Benign neoplasm of sigmoid colon: Secondary | ICD-10-CM

## 2022-08-10 DIAGNOSIS — D124 Benign neoplasm of descending colon: Secondary | ICD-10-CM | POA: Diagnosis not present

## 2022-08-10 DIAGNOSIS — Z08 Encounter for follow-up examination after completed treatment for malignant neoplasm: Secondary | ICD-10-CM

## 2022-08-10 DIAGNOSIS — Z85038 Personal history of other malignant neoplasm of large intestine: Secondary | ICD-10-CM | POA: Diagnosis not present

## 2022-08-10 DIAGNOSIS — K635 Polyp of colon: Secondary | ICD-10-CM | POA: Diagnosis not present

## 2022-08-10 DIAGNOSIS — Z86010 Personal history of colonic polyps: Secondary | ICD-10-CM

## 2022-08-10 MED ORDER — SODIUM CHLORIDE 0.9 % IV SOLN: 500.0000 mL | Freq: Once | INTRAVENOUS | Status: AC

## 2022-08-10 NOTE — Patient Instructions (Signed)
Resume all of your previous medications today.  YOU HAD AN ENDOSCOPIC PROCEDURE TODAY AT THE [facility]:   Refer to the procedure report that was given to you for any specific questions about what was found during the examination.  If the procedure report does not answer your questions, please call your gastroenterologist to clarify.  If you requested that your care partner not be given the details of your procedure findings, then the procedure report has been included in a sealed envelope for you to review at your convenience later.  YOU SHOULD EXPECT: Some feelings of bloating in the abdomen. Passage of more gas than usual.  Walking can help get rid of the air that was put into your GI tract during the procedure and reduce the bloating. If you had a lower endoscopy (such as a colonoscopy or flexible sigmoidoscopy) you may notice spotting of blood in your stool or on the toilet paper. If you underwent a bowel prep for your procedure, you may not have a normal bowel movement for a few days.  Please Note:  You might notice some irritation and congestion in your nose or some drainage.  This is from the oxygen used during your procedure.  There is no need for concern and it should clear up in a day or so.  SYMPTOMS TO REPORT IMMEDIATELY:  Following lower endoscopy (colonoscopy or flexible sigmoidoscopy):  Excessive amounts of blood in the stool  Significant tenderness or worsening of abdominal pains  Swelling of the abdomen that is new, acute  Fever of 100F or higher  For urgent or emergent issues, a gastroenterologist can be reached at any hour by calling [phone]. Do not use MyChart messaging for urgent concerns.    DIET:  We do recommend a small meal at first, but then you may proceed to your regular diet.  Drink plenty of fluids but you should avoid alcoholic beverages for 24 hours.  Try to eat a high fiber diet,and drink plenty of water.   ACTIVITY:  You should plan to  take it easy for the rest of today and you should NOT DRIVE or use heavy machinery until tomorrow (because of the sedation medicines used during the test).    FOLLOW UP: Our staff will call the number listed on your records the next business day following your procedure.  We will call around 7:15- 8:00 am to check on you and address any questions or concerns that you may have regarding the information given to you following your procedure. If we do not reach you, we will leave a message.     If any biopsies were taken you will be contacted by phone or by letter within the next 1-3 weeks.  Please call us at [phone] if you have not heard about the biopsies in 3 weeks.    SIGNATURES/CONFIDENTIALITY: You and/or your care partner have signed paperwork which will be entered into your electronic medical record.  These signatures attest to the fact that that the information above on your After Visit Summary has been reviewed and is understood.  Full responsibility of the confidentiality of this discharge information lies with you and/or your care-partner.

## 2022-08-10 NOTE — Progress Notes (Signed)
Vss nad trans to pacu 

## 2022-08-10 NOTE — Progress Notes (Signed)
Pt's states no medical or surgical changes since previsit or office visit. 

## 2022-08-10 NOTE — Op Note (Signed)
[facility] Patient Name: Demetrus Witherspoon Procedure Date: 08/10/2022 9:24 AM MRN: [id number] Endoscopist: Wilhemina Bonito. Marina Goodell , MD, [id number] Age: 77 Referring MD:  Date of Birth: 06/30/1945 Gender: Male Account #: [id number] Procedure:                Colonoscopy with cold snare polypectomy x 1; biopsy                            polypectomy x 2 Indications:              High risk colon cancer surveillance: Personal                            history of multiple (3 or more) adenomas, High risk                            colon cancer surveillance: Personal history of                            colon cancer (2003). Previous examinations 2003,                            2004, 2005, 2007, 2012, 2018, 2021 Medicines:                Monitored Anesthesia Care Procedure:                Pre-Anesthesia Assessment:                           - Prior to the procedure, a History and Physical                            was performed, and patient medications and                            allergies were reviewed. The patient's tolerance of                            previous anesthesia was also reviewed. The risks                            and benefits of the procedure and the sedation                            options and risks were discussed with the patient.                            All questions were answered, and informed consent                            was obtained. Prior Anticoagulants: The patient has                            taken no anticoagulant or antiplatelet agents. ASA  Grade Assessment: II - A patient with mild systemic                            disease. After reviewing the risks and benefits,                            the patient was deemed in satisfactory condition to                            undergo the procedure.                           After obtaining informed consent, the colonoscope                            was passed under direct  vision. Throughout the                            procedure, the patient's blood pressure, pulse, and                            oxygen saturations were monitored continuously. The                            CF [id number] #[id number] was introduced through the anus                            and advanced to the the cecum, identified by                            appendiceal orifice and ileocecal valve. The rectum                            was photographed. The quality of the bowel                            preparation was good. The colonoscopy was performed                            without difficulty. The patient tolerated the                            procedure well. The bowel preparation used was                            SUPREP via split dose instruction. Scope In: 9:37:32 AM Scope Out: 9:49:19 AM Scope Withdrawal Time: 0 hours 9 minutes 49 seconds  Total Procedure Duration: 0 hours 11 minutes 47 seconds  Findings:                 A 2 mm polyp was found in the descending colon. The                            polyp was removed with  a cold snare. Resection and                            retrieval were complete.                           Two polyps were found in the rectum and sigmoid                            colon. The polyps were 1 mm in size. These polyps                            were removed with a jumbo cold forceps. Resection                            and retrieval were complete.                           Multiple diverticula were found in the left colon.                           There was evidence of prior right hemicolectomy                            with unremarkable anastomosis. The exam was                            otherwise without abnormality on direct and                            retroflexion views. Complications:            No immediate complications. Estimated blood loss:                            None. Estimated Blood Loss:     Estimated blood loss:  none. Impression:               - One 2 mm polyp in the descending colon, removed                            with a cold snare. Resected and retrieved.                           - Two 1 mm polyps in the rectum and in the sigmoid                            colon, removed with a jumbo cold forceps. Resected                            and retrieved.                           - Diverticulosis in the left colon. That is post  right hemicolectomy.                           - The examination was otherwise normal on direct                            and retroflexion views. Recommendation:           - Repeat colonoscopy is not recommended for                            surveillance.                           - Patient has a contact number available for                            emergencies. The signs and symptoms of potential                            delayed complications were discussed with the                            patient. Return to normal activities tomorrow.                            Written discharge instructions were provided to the                            patient.                           - Resume previous diet.                           - Continue present medications.                           - Await pathology results. Wilhemina Bonito. Marina Goodell, MD 08/10/2022 9:55:46 AM This report has been signed electronically.

## 2022-08-10 NOTE — Progress Notes (Signed)
HISTORY OF PRESENT ILLNESS:  Ryan Hernandez is a 77 y.o. male with history of colon cancer and multiple adenomatous colon polyps.  Presents today for surveillance colonoscopy   REVIEW OF SYSTEMS:  All non-GI ROS negative except for  Past Medical History:  Diagnosis Date   Allergy    Anemia    Anxiety    Arthritis    Blood transfusion 2002   Cancer [facility]) 2002   colon   Coronary artery disease    Depression    Diabetes mellitus without complication (HCC)    Fatty tumor    under arms, back,legs, chest   GERD (gastroesophageal reflux disease)    hx of, no medications currently   Heart aneurysm    Aortic aneurysm/   Hyperlipidemia    Hypertension    Pneumonia    PTSD (post-traumatic stress disorder)    Sleep apnea    nightly c-pap    Past Surgical History:  Procedure Laterality Date   APPENDECTOMY     CARDIAC CATHETERIZATION     COLONOSCOPY     CYST EXCISION  2019   spinal cord   EXCISIONAL HEMORRHOIDECTOMY  1975   EXCISIONAL TOTAL KNEE ARTHROPLASTY  01/09/2016   left knee   fatty tumors     right arm/ per pt all over body   PARTIAL COLECTOMY  2002   colectomy   TONSILLECTOMY     TOTAL KNEE ARTHROPLASTY Right 03/09/2022   Procedure: TOTAL KNEE ARTHROPLASTY;  Surgeon: Beverely Low, MD;  Location: [facility];  Service: Orthopedics;  Laterality: Right;  120 min choice and interscalene block    Social History Aarib Cimmino  reports that he quit smoking about 39 years ago. His smoking use included cigarettes. He has never used smokeless tobacco. He reports that he does not drink alcohol and does not use drugs.  family history includes Heart disease in his father; Leukemia in his mother.  Allergies  Allergen Reactions   Ace Inhibitors Cough       PHYSICAL EXAMINATION:  Vital signs: BP (!) 141/75   Pulse (!) 50   Temp (!) 97.1 F (36.2 C) (Skin)   Resp 15   Ht 5\' 10"  (1.778 m)   Wt 223 lb (101.2 kg)   SpO2 99%   BMI 32.00 kg/m  General: Well-developed,  well-nourished, no acute distress HEENT: Sclerae are anicteric, conjunctiva pink. Oral mucosa intact Lungs: Clear Heart: Regular Abdomen: soft, nontender, nondistended, no obvious ascites, no peritoneal signs, normal bowel sounds. No organomegaly. Extremities: No edema Psychiatric: alert and oriented x3. Cooperative    ASSESSMENT:   Personal history colon cancer and colon polyps  PLAN:   Surveillance colonoscopy

## 2022-08-10 NOTE — Progress Notes (Signed)
Called to room to assist during endoscopic procedure.  Patient ID and intended procedure confirmed with present staff. Received instructions for my participation in the procedure from the performing physician.  

## 2022-08-13 ENCOUNTER — Telehealth: Payer: Self-pay

## 2022-08-13 NOTE — Telephone Encounter (Signed)
  Follow up Call-     08/10/2022    8:59 AM  Call back number  Post procedure Call Back phone  # [phone]  Permission to leave phone message Yes     Patient questions:  Do you have a fever, pain , or abdominal swelling? No. Pain Score  0 *  Have you tolerated food without any problems? No.  Have you been able to return to your normal activities? No.  Do you have any questions about your discharge instructions: Diet   Yes.   Medications  Yes.   Follow up visit  Yes.    Do you have questions or concerns about your Care? No.  Actions: * If pain score is 4 or above: No action needed, pain <4.

## 2022-08-14 ENCOUNTER — Encounter: Payer: Self-pay | Admitting: Internal Medicine
